# Patient Record
Sex: Male | Born: 1990 | Race: Black or African American | Hispanic: No | Marital: Single | Smoking: Current every day smoker
Health system: Southern US, Community
[De-identification: ages and names within clinical notes are randomized; demographics above are authoritative.]

## PROBLEM LIST (undated history)

## (undated) DIAGNOSIS — B20 Human immunodeficiency virus [HIV] disease: Secondary | ICD-10-CM

## (undated) HISTORY — DX: Human immunodeficiency virus (HIV) disease: B20

---

## 2011-08-11 ENCOUNTER — Emergency Department: Payer: Self-pay | Admitting: Internal Medicine

## 2012-02-15 ENCOUNTER — Emergency Department: Payer: Self-pay | Admitting: Emergency Medicine
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
## 2016-01-14 DIAGNOSIS — B20 Human immunodeficiency virus [HIV] disease: Secondary | ICD-10-CM

## 2016-01-14 DIAGNOSIS — Z21 Asymptomatic human immunodeficiency virus [HIV] infection status: Secondary | ICD-10-CM

## 2016-01-14 HISTORY — DX: Asymptomatic human immunodeficiency virus (hiv) infection status: Z21

## 2016-01-14 HISTORY — DX: Human immunodeficiency virus (HIV) disease: B20

## 2016-09-19 ENCOUNTER — Other Ambulatory Visit: Payer: Self-pay

## 2016-09-19 NOTE — Telephone Encounter (Signed)
Nurse calling with [facility]. Expected discharge Oct 28, 2016.  Recently diagnosed.  She will fax medical records and patient will have a 30 day supply of medication.  Laurell Josephsammy K Kodi Steil, RN

## 2016-11-12 ENCOUNTER — Ambulatory Visit: Payer: Self-pay

## 2016-11-12 ENCOUNTER — Ambulatory Visit: Payer: Self-pay | Admitting: Infectious Diseases

## 2017-11-18 ENCOUNTER — Ambulatory Visit (INDEPENDENT_AMBULATORY_CARE_PROVIDER_SITE_OTHER): Payer: Self-pay | Admitting: Infectious Diseases

## 2017-11-18 ENCOUNTER — Encounter: Payer: Self-pay | Admitting: Infectious Diseases

## 2017-11-18 VITALS — BP 145/78 | HR 64 | Temp 97.8°F | Ht 68.0 in | Wt 149.0 lb

## 2017-11-18 DIAGNOSIS — B20 Human immunodeficiency virus [HIV] disease: Secondary | ICD-10-CM

## 2017-11-18 DIAGNOSIS — Z113 Encounter for screening for infections with a predominantly sexual mode of transmission: Secondary | ICD-10-CM

## 2017-11-18 DIAGNOSIS — B009 Herpesviral infection, unspecified: Secondary | ICD-10-CM

## 2017-11-18 DIAGNOSIS — Z79899 Other long term (current) drug therapy: Secondary | ICD-10-CM

## 2017-11-18 NOTE — Assessment & Plan Note (Signed)
He appears to be doing well.  Will check his labs today Will get him in with ADAP and pharm Given condoms.  Will see him back in 3 months Explained functions of clinic and available resources.

## 2017-11-18 NOTE — Assessment & Plan Note (Signed)
Will defer rx at this point. Scabbed.

## 2017-11-18 NOTE — Progress Notes (Signed)
[facility] Visit  HPI: Robert Sharp is a 27 y.o. male who presents to the [facility] clinic today to initiate care with Dr. Ninetta LightsHatcher for his HIV infection.  Patient Active Problem List   Diagnosis Date Noted  . HIV disease (HCC) 11/18/2017  . HSV (herpes simplex virus) infection 11/18/2017    Patient's Medications  New Prescriptions   No medications on file  Previous Medications   ABACAVIR-DOLUTEGRAVIR-LAMIVUDINE (TRIUMEQ) 600-50-300 MG TABLET    Take 1 tablet by mouth daily.  Modified Medications   No medications on file  Discontinued Medications   No medications on file    Allergies: Not on File  Past Medical History: Past Medical History:  Diagnosis Date  . HIV (human immunodeficiency virus infection) (HCC) 01/2016    Social History: Social History   Socioeconomic History  . Marital status: Single    Spouse name: None  . Number of children: None  . Years of education: None  . Highest education level: None  Social Needs  . Financial resource strain: None  . Food insecurity - worry: None  . Food insecurity - inability: None  . Transportation needs - medical: None  . Transportation needs - non-medical: None  Occupational History  . None  Tobacco Use  . Smoking status: Former Games developermoker  . Smokeless tobacco: Never Used  Substance and Sexual Activity  . Alcohol use: No    Frequency: Never    Comment: occ  . Drug use: No  . Sexual activity: Not Currently  Other Topics Concern  . None  Social History Narrative  . None    Labs: No results found for: HIV1RNAQUANT, HIV1RNAVL, CD4TABS, HEPBSAB, HEPBSAG, HCVAB  Lipids: No results found for: CHOL, TRIG, HDL, CHOLHDL, VLDL, LDLCALC  Current HIV Regimen: Triumeq  Assessment: Robert Sharp is here today to establish care with Dr. Ninetta LightsHatcher for hi HIV infection.  He is currently on Triumeq and has been for over a year.  No issues or side effects with Triumeq. No problems today.  He  needs to get HMAP but states he already signed up and signed papers with someone.  Jonetta SpeakLuis will check to see if he has applied. He states he has 1 month of Triumeq. I gave him my card and told him to call me when he had 7 days left of his Triumeq to make sure he does not run out medication before he is approved.   Plan: - Continue Triumeq PO once daily - Call me with 7 pills left to make sure you don't run out  [name] L. Kuppelweiser, PharmD, AAHIVP, CPP Infectious Diseases Clinical Pharmacist [facility] 11/18/2017, 11:11 AM

## 2017-11-18 NOTE — Progress Notes (Signed)
   Subjective:    Patient ID: Robert Sharp, male    DOB: 12/02/1990, 27 y.o.   MRN: [id number]  HPI 27 yo M with HIV+, recently released from incarceration 11-16-17. Dx 01-2016 when incarcerated.  Has been on triumeq. Has been feeling well.   The past medical history, family history and social history were reviewed/updated in EPIC  Currently single.  Lives with Aunt. Feels like he has good support.   Review of Systems  Constitutional: Negative for appetite change and unexpected weight change.  HENT: Positive for mouth sores. Negative for trouble swallowing.   Respiratory: Negative for cough and shortness of breath.   Cardiovascular: Negative for chest pain.  Gastrointestinal: Negative for constipation and diarrhea.  Genitourinary: Negative for difficulty urinating and genital sores.  Neurological: Negative for headaches.  Psychiatric/Behavioral: Negative for dysphoric mood and sleep disturbance.  Please see HPI. All other systems reviewed and negative.      Objective:   Physical Exam  Constitutional: He is oriented to person, place, and time. He appears well-developed and well-nourished.  HENT:  Mouth/Throat: No oropharyngeal exudate.  Eyes: EOM are normal. Pupils are equal, round, and reactive to light.  Neck: Neck supple.  Cardiovascular: Normal rate, regular rhythm and normal heart sounds.  Pulmonary/Chest: Effort normal and breath sounds normal.  Abdominal: Soft. Bowel sounds are normal. There is no tenderness. There is no rebound.  Musculoskeletal: He exhibits no edema.  Lymphadenopathy:    He has no cervical adenopathy.  Neurological: He is alert and oriented to person, place, and time. No cranial nerve deficit.  Skin:     Psychiatric: He has a normal mood and affect.          Assessment & Plan:

## 2017-11-19 LAB — URINE CYTOLOGY ANCILLARY ONLY
CHLAMYDIA, DNA PROBE: NEGATIVE
Neisseria Gonorrhea: NEGATIVE

## 2017-11-19 LAB — T-HELPER CELL (CD4) - (RCID CLINIC ONLY)
CD4 T CELL HELPER: 37 % (ref 33–55)
CD4 T Cell Abs: 850 /uL (ref 400–2700)

## 2017-11-20 ENCOUNTER — Encounter: Payer: Self-pay | Admitting: Infectious Diseases

## 2017-11-20 DIAGNOSIS — Z789 Other specified health status: Secondary | ICD-10-CM | POA: Insufficient documentation

## 2017-11-20 LAB — HIV-1 RNA QUANT-NO REFLEX-BLD
HIV 1 RNA QUANT: NOT DETECTED {copies}/mL
HIV-1 RNA QUANT, LOG: NOT DETECTED {Log_copies}/mL

## 2017-11-21 LAB — QUANTIFERON-TB GOLD PLUS
MITOGEN-NIL: 4.37 [IU]/mL
NIL: 0.05 [IU]/mL
QuantiFERON-TB Gold Plus: NEGATIVE
TB1-NIL: <0 IU/mL
TB2-NIL: <0 IU/mL

## 2017-11-23 ENCOUNTER — Encounter: Payer: Self-pay | Admitting: Infectious Diseases

## 2017-11-25 ENCOUNTER — Telehealth: Payer: Self-pay | Admitting: Infectious Diseases

## 2017-11-25 NOTE — Telephone Encounter (Signed)
Called pt and explained his labs to him.

## 2017-11-26 LAB — HLA B*5701: HLA-B*5701 w/rflx HLA-B High: NEGATIVE

## 2017-11-26 LAB — RPR: RPR Ser Ql: NONREACTIVE

## 2017-11-26 LAB — LIPID PANEL
CHOL/HDL RATIO: 3.2 (calc) (ref <5.0)
Cholesterol: 218 mg/dL — ABNORMAL HIGH (ref <200)
HDL: 69 mg/dL (ref >40)
LDL Cholesterol (Calc): 134 mg/dL (calc) — ABNORMAL HIGH
NON-HDL CHOLESTEROL (CALC): 149 mg/dL — AB (ref <130)
TRIGLYCERIDES: 56 mg/dL (ref <150)

## 2017-11-26 LAB — COMPREHENSIVE METABOLIC PANEL
AG Ratio: 1.3 (calc) (ref 1.0–2.5)
ALT: 7 U/L — AB (ref 9–46)
AST: 18 U/L (ref 10–40)
Albumin: 4.7 g/dL (ref 3.6–5.1)
Alkaline phosphatase (APISO): 89 U/L (ref 40–115)
BUN: 16 mg/dL (ref 7–25)
CHLORIDE: 97 mmol/L — AB (ref 98–110)
CO2: 25 mmol/L (ref 20–32)
CREATININE: 1.16 mg/dL (ref 0.60–1.35)
Calcium: 10.1 mg/dL (ref 8.6–10.3)
GLUCOSE: 75 mg/dL (ref 65–99)
Globulin: 3.6 g/dL (calc) (ref 1.9–3.7)
Potassium: 4.7 mmol/L (ref 3.5–5.3)
SODIUM: 136 mmol/L (ref 135–146)
TOTAL PROTEIN: 8.3 g/dL — AB (ref 6.1–8.1)
Total Bilirubin: 0.4 mg/dL (ref 0.2–1.2)

## 2017-11-26 LAB — CBC
HCT: 42.2 % (ref 38.5–50.0)
Hemoglobin: 14.8 g/dL (ref 13.2–17.1)
MCH: 32.8 pg (ref 27.0–33.0)
MCHC: 35.1 g/dL (ref 32.0–36.0)
MCV: 93.6 fL (ref 80.0–100.0)
MPV: 9.5 fL (ref 7.5–12.5)
PLATELETS: 437 10*3/uL — AB (ref 140–400)
RBC: 4.51 10*6/uL (ref 4.20–5.80)
RDW: 13.6 % (ref 11.0–15.0)
WBC: 4.8 10*3/uL (ref 3.8–10.8)

## 2017-11-26 LAB — HEPATITIS A ANTIBODY, TOTAL: Hepatitis A AB,Total: NONREACTIVE

## 2017-11-26 LAB — HEPATITIS C ANTIBODY
Hepatitis C Ab: NONREACTIVE
SIGNAL TO CUT-OFF: 0.04 (ref <1.00)

## 2017-11-26 LAB — HEPATITIS B SURFACE ANTIBODY,QUALITATIVE: Hep B S Ab: REACTIVE — AB

## 2017-12-06 ENCOUNTER — Encounter: Payer: Self-pay | Admitting: Infectious Diseases

## 2017-12-18 ENCOUNTER — Other Ambulatory Visit: Payer: Self-pay | Admitting: *Deleted

## 2017-12-18 DIAGNOSIS — B20 Human immunodeficiency virus [HIV] disease: Secondary | ICD-10-CM

## 2017-12-18 MED ORDER — ABACAVIR-DOLUTEGRAVIR-LAMIVUD 600-50-300 MG PO TABS: 1.0000 | ORAL_TABLET | Freq: Every day | ORAL | 2 refills | Status: DC

## 2017-12-18 NOTE — Progress Notes (Signed)
For patient assistance

## 2017-12-20 ENCOUNTER — Other Ambulatory Visit: Payer: Self-pay | Admitting: Pharmacist Clinician (PhC)/ Clinical Pharmacy Specialist

## 2017-12-20 DIAGNOSIS — B20 Human immunodeficiency virus [HIV] disease: Secondary | ICD-10-CM

## 2017-12-20 MED ORDER — ABACAVIR-DOLUTEGRAVIR-LAMIVUD 600-50-300 MG PO TABS: 1.0000 | ORAL_TABLET | Freq: Every day | ORAL | 2 refills | Status: DC

## 2017-12-20 MED FILL — TRIUMEQ 600-50-300 MG TABS: 600-50-300 | 30 days supply | Qty: 30 | Fill #0

## 2017-12-20 NOTE — Progress Notes (Addendum)
Approved through [employer] right now for 3 months while waiting on ADAP.   ID [id number] BIN [id number] PCN [id number] GRP [id number]

## 2017-12-30 ENCOUNTER — Encounter: Payer: Self-pay | Admitting: Infectious Diseases

## 2018-01-07 ENCOUNTER — Other Ambulatory Visit: Payer: Self-pay | Admitting: Pharmacist Clinician (PhC)/ Clinical Pharmacy Specialist

## 2018-01-13 MED FILL — TRIUMEQ 600-50-300 MG TABS: 600-50-300 | 30 days supply | Qty: 30 | Fill #1

## 2018-02-12 ENCOUNTER — Other Ambulatory Visit: Payer: Self-pay

## 2018-02-12 ENCOUNTER — Encounter: Payer: Self-pay | Admitting: Infectious Diseases

## 2018-02-12 DIAGNOSIS — B20 Human immunodeficiency virus [HIV] disease: Secondary | ICD-10-CM

## 2018-02-13 LAB — T-HELPER CELL (CD4) - (RCID CLINIC ONLY)
CD4 T CELL ABS: 910 /uL (ref 400–2700)
CD4 T CELL HELPER: 28 % — AB (ref 33–55)

## 2018-02-14 LAB — HIV-1 RNA QUANT-NO REFLEX-BLD
HIV 1 RNA Quant: <20 copies/mL
HIV-1 RNA Quant, Log: <1.3 Log copies/mL

## 2018-02-17 ENCOUNTER — Other Ambulatory Visit: Payer: Self-pay

## 2018-03-03 ENCOUNTER — Encounter: Payer: Self-pay | Admitting: Infectious Diseases

## 2018-03-03 ENCOUNTER — Ambulatory Visit (INDEPENDENT_AMBULATORY_CARE_PROVIDER_SITE_OTHER): Payer: Self-pay | Admitting: Infectious Diseases

## 2018-03-03 VITALS — BP 121/76 | HR 71 | Temp 98.0°F | Ht 68.0 in | Wt 139.0 lb

## 2018-03-03 DIAGNOSIS — Z23 Encounter for immunization: Secondary | ICD-10-CM

## 2018-03-03 DIAGNOSIS — B20 Human immunodeficiency virus [HIV] disease: Secondary | ICD-10-CM

## 2018-03-03 NOTE — Addendum Note (Signed)
Addended by: Towanda Octave on: 03/03/2018 11:21 AM   Modules accepted: Orders

## 2018-03-03 NOTE — Assessment & Plan Note (Signed)
He is doing well Will continue triumeq He has condoms He will get prevnar today Offer Hep A as well Will see him back in 6 months

## 2018-03-03 NOTE — Progress Notes (Signed)
   Subjective:    Patient ID: Robert Sharp, male    DOB: May 23, 1991, 27 y.o.   MRN: [id number]  HPI 27 yo M with HIV+, recently released from incarceration 11-16-17. Dx 01-2016 when incarcerated.  Has been on triumeq. He had first visit in [facility] on 11-2017 and was continued on triumeq.  He has also been seen by pharm.  Has been taking triumeq without problems.  Worried has multiple bug bites while sleeping.  Does not feel like his usual eczema.   HIV 1 RNA Quant (copies/mL)  Date Value  02/12/2018 <20 NOT DETECTED  11/18/2017 <20 NOT DETECTED   CD4 T Cell Abs (/uL)  Date Value  02/12/2018 910  11/18/2017 850    Review of Systems  Constitutional: Negative for appetite change and unexpected weight change.  Respiratory: Negative for cough and shortness of breath.   Gastrointestinal: Negative for abdominal pain, constipation and diarrhea.  Genitourinary: Negative for difficulty urinating.  Psychiatric/Behavioral: Negative for sleep disturbance.  Please see HPI. All other systems reviewed and negative.     Objective:   Physical Exam  Constitutional: He appears well-developed and well-nourished.  HENT:  Mouth/Throat: No oropharyngeal exudate.  Eyes: Pupils are equal, round, and reactive to light. Conjunctivae and EOM are normal.  Neck: Normal range of motion. Neck supple.  Cardiovascular: Normal rate, regular rhythm and normal heart sounds.  Pulmonary/Chest: Effort normal and breath sounds normal.  Abdominal: Soft. Bowel sounds are normal. He exhibits no distension.  Musculoskeletal: Normal range of motion. He exhibits no edema.  Lymphadenopathy:    He has no cervical adenopathy.  Skin:     Psychiatric: He has a normal mood and affect.      Assessment & Plan:

## 2018-04-12 ENCOUNTER — Other Ambulatory Visit: Payer: Self-pay

## 2018-04-12 ENCOUNTER — Encounter (HOSPITAL_COMMUNITY): Payer: Self-pay | Admitting: Emergency Medicine

## 2018-04-12 ENCOUNTER — Emergency Department (HOSPITAL_COMMUNITY)
Admission: EM | Admit: 2018-04-12 | Discharge: 2018-04-12 | Disposition: A | Discharge status: Home / Self Care | Payer: Self-pay | Attending: Emergency Medicine | Admitting: Emergency Medicine

## 2018-04-12 DIAGNOSIS — R5383 Other fatigue: Secondary | ICD-10-CM | POA: Insufficient documentation

## 2018-04-12 DIAGNOSIS — Z87891 Personal history of nicotine dependence: Secondary | ICD-10-CM | POA: Insufficient documentation

## 2018-04-12 DIAGNOSIS — Z21 Asymptomatic human immunodeficiency virus [HIV] infection status: Secondary | ICD-10-CM | POA: Insufficient documentation

## 2018-04-12 DIAGNOSIS — Z79899 Other long term (current) drug therapy: Secondary | ICD-10-CM | POA: Insufficient documentation

## 2018-04-12 LAB — BASIC METABOLIC PANEL
Anion gap: 11 (ref 5–15)
BUN: 9 mg/dL (ref 6–20)
CALCIUM: 9.4 mg/dL (ref 8.9–10.3)
CO2: 27 mmol/L (ref 22–32)
Chloride: 97 mmol/L — ABNORMAL LOW (ref 98–111)
Creatinine, Ser: 1.02 mg/dL (ref 0.61–1.24)
GFR calc Af Amer: >60 mL/min (ref >60)
GFR calc non Af Amer: >60 mL/min (ref >60)
GLUCOSE: 99 mg/dL (ref 70–99)
POTASSIUM: 4.3 mmol/L (ref 3.5–5.1)
Sodium: 135 mmol/L (ref 135–145)

## 2018-04-12 LAB — CBC
HEMATOCRIT: 44.5 % (ref 39.0–52.0)
HEMOGLOBIN: 14.9 g/dL (ref 13.0–17.0)
MCH: 33.5 pg (ref 26.0–34.0)
MCHC: 33.5 g/dL (ref 30.0–36.0)
MCV: 100 fL (ref 78.0–100.0)
Platelets: 383 10*3/uL (ref 150–400)
RBC: 4.45 MIL/uL (ref 4.22–5.81)
RDW: 15.1 % (ref 11.5–15.5)
WBC: 5.9 10*3/uL (ref 4.0–10.5)

## 2018-04-12 NOTE — Discharge Instructions (Addendum)
Continue your current medications, follow up with your primary care doctor next week if your symptoms persist

## 2018-04-12 NOTE — ED Provider Notes (Signed)
[facility] EMERGENCY DEPARTMENT Provider Note   CSN: [id number] Arrival date & time: 04/12/18  1206     History   Chief Complaint Chief Complaint  Patient presents with  . Fatigue    HPI Robert Sharp is a 27 y.o. male.  HPI Presented to the emergency room for evaluation of generalized fatigue.  Patient states the symptoms started about a week ago.  He was not able to go to work for a couple of days.  He was scheduled to go back to work today but want to make sure everything was okay.  He denies any trouble with headache.  No chest pain.  No fevers or chills.  No vomiting or diarrhea.  Patient has been able to eat and drink although he does not think his appetite is great Past Medical History:  Diagnosis Date  . HIV (human immunodeficiency virus infection) (HCC) 01/2016    Patient Active Problem List   Diagnosis Date Noted  . Hepatitis B immune 11/20/2017  . HIV disease (HCC) 11/18/2017  . HSV (herpes simplex virus) infection 11/18/2017    History reviewed. No pertinent surgical history.      Home Medications    Prior to Admission medications   Medication Sig Start Date End Date Taking? Authorizing Provider  abacavir-dolutegravir-lamiVUDine (TRIUMEQ) 600-50-300 MG tablet Take 1 tablet by mouth daily. 12/20/17   Ginnie Smart, MD    Family History History reviewed. No pertinent family history.  Social History Social History   Tobacco Use  . Smoking status: Former Games developer  . Smokeless tobacco: Never Used  Substance Use Topics  . Alcohol use: No    Frequency: Never    Comment: occ  . Drug use: No     Allergies   Patient has no known allergies.   Review of Systems Review of Systems  All other systems reviewed and are negative.    Physical Exam Updated Vital Signs BP (!) 145/104 (BP Location: Right Arm)   Pulse 73   Temp 99 F (37.2 C) (Oral)   Resp 16   SpO2 100%   Physical Exam  Constitutional: He appears  well-developed and well-nourished. No distress.  HENT:  Head: Normocephalic and atraumatic.  Right Ear: External ear normal.  Left Ear: External ear normal.  Eyes: Conjunctivae are normal. Right eye exhibits no discharge. Left eye exhibits no discharge. No scleral icterus.  Neck: Neck supple. No tracheal deviation present.  Cardiovascular: Normal rate, regular rhythm and intact distal pulses.  Pulmonary/Chest: Effort normal and breath sounds normal. No stridor. No respiratory distress. He has no wheezes. He has no rales.  Abdominal: Soft. Bowel sounds are normal. He exhibits no distension. There is no tenderness. There is no rebound and no guarding.  Musculoskeletal: He exhibits no edema or tenderness.  Neurological: He is alert. He has normal strength. No cranial nerve deficit (no facial droop, extraocular movements intact, no slurred speech) or sensory deficit. He exhibits normal muscle tone. He displays no seizure activity. Coordination normal.  Skin: Skin is warm and dry. No rash noted.  Psychiatric: He has a normal mood and affect.  Nursing note and vitals reviewed.    ED Treatments / Results  Labs (all labs ordered are listed, but only abnormal results are displayed) Labs Reviewed  BASIC METABOLIC PANEL - Abnormal; Notable for the following components:      Result Value   Chloride 97 (*)    All other components within normal limits  CBC  Procedures Procedures (including critical care time)  Medications Ordered in ED Medications - No data to display   Initial Impression / Assessment and Plan / ED Course  I have reviewed the triage vital signs and the nursing notes.  Pertinent labs & imaging results that were available during my care of the patient were reviewed by me and considered in my medical decision making (see chart for details).  Clinical Course as of Apr 12 1405  Sat Apr 12, 2018  1405 Labs reviewed.  No clinically significant abnormalities   [JK]      Clinical Course User Index [JK] Linwood DibblesKnapp, Aloura Matsuoka, MD   Patient presented with complaints of fatigue.  He is not having any trouble with any symptoms to suggest infection.  He is not having any pain anywhere.  He has been able to eat and drink.  Patient is able to walk around without any difficulty.  He has a normal neuro exam.  ED work-up is reassuring.  No clear etiology but he appears stable for discharge.  Patient states he wants to go back to work today.  Work note provided.  Final Clinical Impressions(s) / ED Diagnoses   Final diagnoses:  Fatigue, unspecified type    ED Discharge Orders    None       Linwood DibblesKnapp, Lillieann Pavlich, MD 04/12/18 1406

## 2018-04-12 NOTE — ED Notes (Signed)
Patient stating that he needs to leave to go to work, Dr. Lynelle DoctorKnapp at bedside speaking with patient.

## 2018-04-12 NOTE — ED Triage Notes (Signed)
Pt presents to ED for assessment of generalized weakness and fatigue x 1 week.  Hx of HIV, states he has been taking all of his medications on schedule.  Denies fevers and chills.

## 2018-05-02 ENCOUNTER — Ambulatory Visit: Payer: Self-pay

## 2018-08-02 ENCOUNTER — Other Ambulatory Visit: Payer: Self-pay | Admitting: Infectious Diseases

## 2018-08-02 DIAGNOSIS — B20 Human immunodeficiency virus [HIV] disease: Secondary | ICD-10-CM

## 2018-08-20 ENCOUNTER — Other Ambulatory Visit: Payer: Self-pay

## 2018-08-21 ENCOUNTER — Encounter: Payer: Self-pay | Admitting: Infectious Diseases

## 2018-09-02 ENCOUNTER — Ambulatory Visit: Payer: Self-pay | Admitting: Infectious Diseases

## 2018-09-03 ENCOUNTER — Ambulatory Visit: Payer: Self-pay | Admitting: Infectious Diseases

## 2018-09-15 ENCOUNTER — Other Ambulatory Visit: Payer: Self-pay | Admitting: Infectious Diseases

## 2018-09-15 ENCOUNTER — Telehealth: Payer: Self-pay

## 2018-09-15 DIAGNOSIS — B20 Human immunodeficiency virus [HIV] disease: Secondary | ICD-10-CM

## 2018-09-15 NOTE — Telephone Encounter (Signed)
Called patient to schedule an appointment with our office for lab and with Dr. Ninetta LightsHatcher. Patient missed appointments scheduled in November. Was able to reach patient and schedule office visit. Patient denies missing any days of medicine. Is aware of appointments. Robert CourierJose Sharp Robert Sharp, [place]

## 2018-09-19 ENCOUNTER — Other Ambulatory Visit: Payer: Self-pay

## 2018-10-01 ENCOUNTER — Ambulatory Visit: Payer: Self-pay | Admitting: Infectious Diseases

## 2018-10-20 ENCOUNTER — Other Ambulatory Visit: Payer: Self-pay | Admitting: Infectious Diseases

## 2018-10-20 ENCOUNTER — Telehealth: Payer: Self-pay

## 2018-10-20 DIAGNOSIS — B20 Human immunodeficiency virus [HIV] disease: Secondary | ICD-10-CM

## 2018-10-20 NOTE — Telephone Encounter (Signed)
Attempted to call patient to schedule follow-up appointment with our office. Patient last seen on 02/2018 and has had multiple no shows. Left message for patient to call office to schedule appointment. Robert Sharp, [place]

## 2018-10-24 ENCOUNTER — Other Ambulatory Visit: Payer: Self-pay

## 2018-11-07 ENCOUNTER — Encounter: Payer: Self-pay | Admitting: Family

## 2018-12-01 ENCOUNTER — Other Ambulatory Visit: Payer: Self-pay | Admitting: Infectious Diseases

## 2018-12-01 DIAGNOSIS — B20 Human immunodeficiency virus [HIV] disease: Secondary | ICD-10-CM

## 2018-12-18 ENCOUNTER — Other Ambulatory Visit: Payer: Self-pay

## 2018-12-18 DIAGNOSIS — B20 Human immunodeficiency virus [HIV] disease: Secondary | ICD-10-CM

## 2018-12-18 MED ORDER — ABACAVIR-DOLUTEGRAVIR-LAMIVUD 600-50-300 MG PO TABS
1.0000 | ORAL_TABLET | Freq: Every day | ORAL | 0 refills | Status: DC
Start: 2018-12-18 — End: 2019-03-17

## 2018-12-18 NOTE — Progress Notes (Unsigned)
Patient called and schedule appointments with financial, lab and Marcos Eke, NP. Patient advised to keep scheduled appointment for continued refills.  Valarie Cones, LPN

## 2018-12-23 ENCOUNTER — Ambulatory Visit: Payer: Self-pay

## 2018-12-23 ENCOUNTER — Other Ambulatory Visit: Payer: Self-pay

## 2018-12-31 ENCOUNTER — Other Ambulatory Visit: Payer: Self-pay

## 2018-12-31 ENCOUNTER — Ambulatory Visit: Payer: Self-pay

## 2019-01-06 ENCOUNTER — Encounter: Payer: Self-pay | Admitting: Family

## 2019-01-17 ENCOUNTER — Other Ambulatory Visit: Payer: Self-pay | Admitting: Infectious Diseases

## 2019-01-17 DIAGNOSIS — B20 Human immunodeficiency virus [HIV] disease: Secondary | ICD-10-CM

## 2019-03-17 ENCOUNTER — Other Ambulatory Visit: Payer: Self-pay | Admitting: Infectious Diseases

## 2019-03-17 ENCOUNTER — Telehealth: Payer: Self-pay | Admitting: Pharmacy Technician

## 2019-03-17 ENCOUNTER — Ambulatory Visit: Payer: Self-pay

## 2019-03-17 ENCOUNTER — Other Ambulatory Visit: Payer: Self-pay

## 2019-03-17 ENCOUNTER — Encounter: Payer: Self-pay | Admitting: Infectious Diseases

## 2019-03-17 ENCOUNTER — Ambulatory Visit (INDEPENDENT_AMBULATORY_CARE_PROVIDER_SITE_OTHER): Payer: Self-pay | Admitting: Infectious Diseases

## 2019-03-17 VITALS — BP 132/60 | HR 80 | Temp 98.2°F | Ht 68.0 in | Wt 134.0 lb

## 2019-03-17 DIAGNOSIS — Z79899 Other long term (current) drug therapy: Secondary | ICD-10-CM

## 2019-03-17 DIAGNOSIS — B20 Human immunodeficiency virus [HIV] disease: Secondary | ICD-10-CM

## 2019-03-17 DIAGNOSIS — Z23 Encounter for immunization: Secondary | ICD-10-CM

## 2019-03-17 DIAGNOSIS — Z0000 Encounter for general adult medical examination without abnormal findings: Secondary | ICD-10-CM

## 2019-03-17 DIAGNOSIS — Z113 Encounter for screening for infections with a predominantly sexual mode of transmission: Secondary | ICD-10-CM

## 2019-03-17 MED ORDER — ABACAVIR-DOLUTEGRAVIR-LAMIVUD 600-50-300 MG PO TABS: 1.0000 | ORAL_TABLET | Freq: Every day | ORAL | 3 refills | Status: DC

## 2019-03-17 NOTE — Patient Instructions (Addendum)
Very nice to meet you today.   We will get you back on your Triumeq once a day for your HIV.   We need to see you back in July to re-apply for your HIV insurance (Ryan Garfield / ADAP). This will need to be done every January and July for as long as you need this assistance.   Please make an appointment to see Dr. Ninetta Lights or [name] again in 4 months to check in again.   Vaccines updated today: 1. Hepatitis A vaccine 2. Pneumonia vaccine

## 2019-03-17 NOTE — Progress Notes (Signed)
Subjective:    Patient ID: Robert Sharp, male    DOB: 1991-08-23, 28 y.o.   MRN: [id number]  HPI 28 yo M with HIV+, Dx 01-2016 when incarcerated (released Feb 2019) He has not been seen since May 2019 when he was put on Triumeq. He had continued to take this every day up until 2 weeks ago when he was asked to come in for office visit to refill his medications. He is nervous about being off his medications for this time frame and hopes he will be OK. No new concerns to note with his health. He has had no changes to his health history since our last meeting with him; no significant periods of illness, hospitalizations or ER visits.  He has not been sexually active for over a year as he is 'working on him'.    Review of Systems  Constitutional: Negative for appetite change and unexpected weight change.  Respiratory: Negative for cough and shortness of breath.   Gastrointestinal: Negative for abdominal pain, constipation and diarrhea.  Genitourinary: Negative for difficulty urinating.  Psychiatric/Behavioral: Negative for sleep disturbance.  Please see HPI. All other systems reviewed and negative.     Objective:   Physical Exam Constitutional:      Comments: Pleasant. Comfortably seated in chair.   HENT:     Mouth/Throat:     Mouth: No oral lesions.     Dentition: Normal dentition. No dental caries.  Eyes:     General: No scleral icterus. Cardiovascular:     Rate and Rhythm: Normal rate and regular rhythm.     Heart sounds: Normal heart sounds.  Pulmonary:     Effort: Pulmonary effort is normal.     Breath sounds: Normal breath sounds.  Abdominal:     General: There is no distension.     Palpations: Abdomen is soft.     Tenderness: There is no abdominal tenderness.  Lymphadenopathy:     Cervical: No cervical adenopathy.  Skin:    General: Skin is warm and dry.     Findings: No rash.  Neurological:     Mental Status: He is alert and oriented to person, place, and time.       Assessment & Plan:   Problem List Items Addressed This Visit      Unprioritized   Healthcare maintenance    Hepatitis A #2 today.       HIV disease (HCC) (Chronic)    Acknowledges that transportation is a problem for him. Relies on his grandmother who, although knows about his HIV, does not "always feel like driving him to appointments." I asked him to reach out to our clinic so we can get him back routinely to monitor him and ensure his success.   Will continue Triumeq for him - sounds like he stopped abruptly and did not space out his medicines so no significant concern with resistance mutations. Will draw VL, CD4, RPR and BMP/CBC today.   Safe sex discussed. Condoms declined. Reminded him about ADAP application coming up in July.   Return in about 4 months (around 07/17/2019). Can do labs at the visit in observance of his transportation difficulties.        Relevant Medications   abacavir-dolutegravir-lamiVUDine (TRIUMEQ) 600-50-300 MG tablet   Other Relevant Orders   HIV-1 RNA quant-no reflex-bld   T-helper cell (CD4)- ([facility] clinic only) (Completed)   Basic metabolic panel (Completed)   CBC (Completed)    Other Visit Diagnoses    Need for  hepatitis A vaccination    -  Primary   Routine screening for STI (sexually transmitted infection)       Relevant Orders   RPR (Completed)   High risk medication use       Relevant Orders   Lipid panel (Completed)      Rexene AlbertsStephanie Christophere Hillhouse, MSN, NP-C [facility]  [email or web address] Pager: [phone] Office: [phone] [facility] Main Line: [phone]

## 2019-03-17 NOTE — Telephone Encounter (Signed)
[facility] Patient Advocate Encounter   Was successful in obtaining Viiv 30 day fill for Triumeq.  This will make the patients copay 0 until he is approved for ADAP  I have spoken with the patient.    The billing information is as follows and has been shared with the patient.  RxBin: [id number] PCN: [id number] Member ID: [id number] Group ID: [id number]  Robert Sharp E. Dimas Aguas CPhT [facility] for Infectious Disease Phone: [phone] Fax:  [phone]

## 2019-03-18 LAB — T-HELPER CELL (CD4) - (RCID CLINIC ONLY)
CD4 % Helper T Cell: 39 % (ref 33–65)
CD4 T Cell Abs: 1001 /uL (ref 400–1790)

## 2019-03-19 DIAGNOSIS — Z0000 Encounter for general adult medical examination without abnormal findings: Secondary | ICD-10-CM | POA: Insufficient documentation

## 2019-03-19 NOTE — Assessment & Plan Note (Signed)
Hepatitis A #2 today

## 2019-03-19 NOTE — Assessment & Plan Note (Addendum)
Acknowledges that transportation is a problem for him. Relies on his grandmother who, although knows about his HIV, does not "always feel like driving him to appointments." I asked him to reach out to our clinic so we can get him back routinely to monitor him and ensure his success.   Will continue Triumeq for him - sounds like he stopped abruptly and did not space out his medicines so no significant concern with resistance mutations. Will draw VL, CD4, RPR and BMP/CBC today.   Safe sex discussed. Condoms declined. Reminded him about ADAP application coming up in July.   Return in about 4 months (around 07/17/2019). Can do labs at the visit in observance of his transportation difficulties.

## 2019-03-24 LAB — BASIC METABOLIC PANEL
BUN: 18 mg/dL (ref 7–25)
CO2: 22 mmol/L (ref 20–32)
Calcium: 10.1 mg/dL (ref 8.6–10.3)
Chloride: 104 mmol/L (ref 98–110)
Creat: 0.97 mg/dL (ref 0.60–1.35)
Glucose, Bld: 87 mg/dL (ref 65–99)
Potassium: 4.3 mmol/L (ref 3.5–5.3)
Sodium: 139 mmol/L (ref 135–146)

## 2019-03-24 LAB — CBC
HCT: 43.9 % (ref 38.5–50.0)
Hemoglobin: 15.2 g/dL (ref 13.2–17.1)
MCH: 33.6 pg — ABNORMAL HIGH (ref 27.0–33.0)
MCHC: 34.6 g/dL (ref 32.0–36.0)
MCV: 96.9 fL (ref 80.0–100.0)
MPV: 9.9 fL (ref 7.5–12.5)
Platelets: 397 10*3/uL (ref 140–400)
RBC: 4.53 10*6/uL (ref 4.20–5.80)
RDW: 13.3 % (ref 11.0–15.0)
WBC: 5.3 10*3/uL (ref 3.8–10.8)

## 2019-03-24 LAB — LIPID PANEL
Cholesterol: 223 mg/dL — ABNORMAL HIGH (ref <200)
HDL: 68 mg/dL (ref >=40)
LDL Cholesterol (Calc): 123 mg/dL (calc) — ABNORMAL HIGH
Non-HDL Cholesterol (Calc): 155 mg/dL (calc) — ABNORMAL HIGH (ref <130)
Total CHOL/HDL Ratio: 3.3 (calc) (ref <5.0)
Triglycerides: 206 mg/dL — ABNORMAL HIGH (ref <150)

## 2019-03-24 LAB — HIV-1 RNA QUANT-NO REFLEX-BLD
HIV 1 RNA Quant: 11900 copies/mL — ABNORMAL HIGH
HIV-1 RNA Quant, Log: 4.08 Log copies/mL — ABNORMAL HIGH

## 2019-03-24 LAB — RPR: RPR Ser Ql: NONREACTIVE

## 2019-04-14 ENCOUNTER — Other Ambulatory Visit: Payer: Self-pay | Admitting: Infectious Diseases

## 2019-04-14 DIAGNOSIS — B20 Human immunodeficiency virus [HIV] disease: Secondary | ICD-10-CM

## 2019-04-22 ENCOUNTER — Ambulatory Visit: Payer: Self-pay

## 2019-05-27 ENCOUNTER — Other Ambulatory Visit: Payer: Self-pay | Admitting: Pharmacist

## 2019-05-27 ENCOUNTER — Telehealth: Payer: Self-pay | Admitting: Pharmacy Technician

## 2019-05-27 DIAGNOSIS — B20 Human immunodeficiency virus [HIV] disease: Secondary | ICD-10-CM

## 2019-05-27 MED ORDER — TRIUMEQ 600-50-300 MG PO TABS: 1.0000 | ORAL_TABLET | Freq: Every day | ORAL | 0 refills | Status: DC

## 2019-05-27 NOTE — Progress Notes (Signed)
Printing off Triumeq Rx for [employer] connect patient assistance.

## 2019-05-27 NOTE — Telephone Encounter (Addendum)
Mr. Robert Sharp did not apply for HMAP as originally planned.  I have faxed an application to [employer] and he has been approved through 03/15/2020.  Their pharmacy will mail the medication directly to his home.  Venida Jarvis. Nadara Mustard [place] Patient [facility] for Infectious Disease Phone: [phone] Fax:  [phone]

## 2019-07-17 ENCOUNTER — Other Ambulatory Visit: Payer: Self-pay | Admitting: *Deleted

## 2019-07-17 DIAGNOSIS — B20 Human immunodeficiency virus [HIV] disease: Secondary | ICD-10-CM

## 2019-07-20 ENCOUNTER — Other Ambulatory Visit: Payer: Self-pay

## 2019-08-03 ENCOUNTER — Encounter: Payer: Self-pay | Admitting: Infectious Diseases

## 2019-10-20 ENCOUNTER — Encounter: Payer: Self-pay | Admitting: Infectious Diseases

## 2019-10-20 NOTE — Progress Notes (Signed)
Patient ID: Robert Sharp, male   DOB: 1991-05-11, 29 y.o.   MRN: [id number] Working Viral Load List called patient left message to call for appointment

## 2019-11-05 ENCOUNTER — Ambulatory Visit: Payer: Self-pay

## 2019-11-05 ENCOUNTER — Other Ambulatory Visit: Payer: Self-pay

## 2019-11-09 ENCOUNTER — Ambulatory Visit: Payer: Self-pay

## 2019-11-09 ENCOUNTER — Other Ambulatory Visit: Payer: Self-pay

## 2019-11-09 DIAGNOSIS — B20 Human immunodeficiency virus [HIV] disease: Secondary | ICD-10-CM

## 2019-11-10 LAB — T-HELPER CELL (CD4) - (RCID CLINIC ONLY)
CD4 % Helper T Cell: 44 % (ref 33–65)
CD4 T Cell Abs: 1266 /uL (ref 400–1790)

## 2019-11-12 LAB — HIV-1 RNA QUANT-NO REFLEX-BLD
HIV 1 RNA Quant: <20 copies/mL
HIV-1 RNA Quant, Log: <1.3 Log copies/mL

## 2019-11-26 ENCOUNTER — Other Ambulatory Visit: Payer: Self-pay

## 2019-11-26 ENCOUNTER — Encounter: Payer: Self-pay | Admitting: Infectious Diseases

## 2019-11-26 ENCOUNTER — Ambulatory Visit (INDEPENDENT_AMBULATORY_CARE_PROVIDER_SITE_OTHER): Payer: Self-pay | Admitting: Pharmacist

## 2019-11-26 DIAGNOSIS — B20 Human immunodeficiency virus [HIV] disease: Secondary | ICD-10-CM

## 2019-11-26 MED ORDER — TRIUMEQ 600-50-300 MG PO TABS: 1.0000 | ORAL_TABLET | Freq: Every day | ORAL | 5 refills | Status: DC

## 2019-11-26 NOTE — Progress Notes (Signed)
HPI: Robert Sharp is a 29 y.o. male who presents to the [facility] pharmacy clinic for HIV follow-up.  Patient Active Problem List   Diagnosis Date Noted  . Healthcare maintenance 03/19/2019  . Hepatitis B immune 11/20/2017  . HIV disease (HCC) 11/18/2017  . HSV (herpes simplex virus) infection 11/18/2017    Patient's Medications  New Prescriptions   No medications on file  Previous Medications   ABACAVIR-DOLUTEGRAVIR-LAMIVUDINE (TRIUMEQ) 600-50-300 MG TABLET    Take 1 tablet by mouth daily.  Modified Medications   No medications on file  Discontinued Medications   No medications on file    Allergies: No Known Allergies  Past Medical History: Past Medical History:  Diagnosis Date  . HIV (human immunodeficiency virus infection) (HCC) 01/2016    Social History: Social History   Socioeconomic History  . Marital status: Single    Spouse name: Not on file  . Number of children: Not on file  . Years of education: Not on file  . Highest education level: Not on file  Occupational History  . Not on file  Tobacco Use  . Smoking status: Former Games developer  . Smokeless tobacco: Never Used  Substance and Sexual Activity  . Alcohol use: Yes    Alcohol/week: 20.0 standard drinks    Types: 20 Cans of beer per week    Comment: 4-5 days / week  . Drug use: No  . Sexual activity: Yes    Partners: Male    Birth control/protection: Condom    Comment: declined condoms  Other Topics Concern  . Not on file  Social History Narrative  . Not on file   Social Determinants of Health   Financial Resource Strain:   . Difficulty of Paying Living Expenses: Not on file  Food Insecurity:   . Worried About Programme researcher, broadcasting/film/video in the Last Year: Not on file  . Ran Out of Food in the Last Year: Not on file  Transportation Needs:   . Lack of Transportation (Medical): Not on file  . Lack of Transportation (Non-Medical): Not on file  Physical Activity:   . Days of Exercise per Week: Not on  file  . Minutes of Exercise per Session: Not on file  Stress:   . Feeling of Stress : Not on file  Social Connections:   . Frequency of Communication with Friends and Family: Not on file  . Frequency of Social Gatherings with Friends and Family: Not on file  . Attends Religious Services: Not on file  . Active Member of Clubs or Organizations: Not on file  . Attends Banker Meetings: Not on file  . Marital Status: Not on file    Labs: Lab Results  Component Value Date   HIV1RNAQUANT <20 NOT DETECTED 11/09/2019   HIV1RNAQUANT 11,900 (H) 03/17/2019   HIV1RNAQUANT <20 NOT DETECTED 02/12/2018   CD4TABS 1,266 11/09/2019   CD4TABS 1,001 03/17/2019   CD4TABS 910 02/12/2018    RPR and STI Lab Results  Component Value Date   LABRPR NON-REACTIVE 03/17/2019   LABRPR NON-REACTIVE 11/18/2017    STI Results GC CT  11/18/2017 Negative Negative    Hepatitis B Lab Results  Component Value Date   HEPBSAB REACTIVE (A) 11/18/2017   Hepatitis C Lab Results  Component Value Date   HEPCAB NON-REACTIVE 11/18/2017   Hepatitis A Lab Results  Component Value Date   HAV NON-REACTIVE 11/18/2017   Lipids: Lab Results  Component Value Date   CHOL 223 (H) 03/17/2019  TRIG 206 (H) 03/17/2019   HDL 68 03/17/2019   CHOLHDL 3.3 03/17/2019   LDLCALC 123 (H) 03/17/2019    Current HIV Regimen: Triumeq  Assessment: Robert Sharp arrives to clinic intoxicated from alcohol and possibly high from marijuana. The patient refused to disclose how much he drank. He says he does not drive so he has issues with transportation to get to clinic. He is here for HIV follow-up. On 11/09/19, his HIV-1 RNA was undetectable and his CD4 count was 1,266. Robert Sharp reports being out of Trimeq for possibly up to 5 months. He was not sure how long it's been since he last took the medication. He says he does well on the Triumeq when he does take it.   Robert Sharp asked about his labs and we explained to him that  while he is still undetectable, he needs to start taking his medications again to remain undetectable. We told the patient that refills for Triumeq and he needs to go straight to [facility] on [address] to pick up his prescription after this appointment. He requested to have his medication mailed from now on. Sent rest of his prescriptions to [facility] in [place] so that he can have Triumeq delivered.  Robert Sharp was very drunk and hard to follow during conversation with him during the appointment.  Plan: - Labs: CBC, lipid panel, BMP, RPR - Vaccinations needed: influenza, meningitis, HPV - Continue Triumeq. Sent to [facility] on [address] - Follow-up with Stephanie 01/25/20 at 2:45 PM   Shahira Fiske, 4th Year PharmD Candidate 

## 2019-11-26 NOTE — Progress Notes (Unsigned)
HPI: Robert Sharp is a 29 y.o. male who presents to the [facility] pharmacy clinic for HIV follow-up.  Patient Active Problem List   Diagnosis Date Noted  . Healthcare maintenance 03/19/2019  . Hepatitis B immune 11/20/2017  . HIV disease (HCC) 11/18/2017  . HSV (herpes simplex virus) infection 11/18/2017    Patient's Medications  New Prescriptions   No medications on file  Previous Medications   ABACAVIR-DOLUTEGRAVIR-LAMIVUDINE (TRIUMEQ) 600-50-300 MG TABLET    Take 1 tablet by mouth daily.  Modified Medications   No medications on file  Discontinued Medications   No medications on file    Allergies: No Known Allergies  Past Medical History: Past Medical History:  Diagnosis Date  . HIV (human immunodeficiency virus infection) (HCC) 01/2016    Social History: Social History   Socioeconomic History  . Marital status: Single    Spouse name: Not on file  . Number of children: Not on file  . Years of education: Not on file  . Highest education level: Not on file  Occupational History  . Not on file  Tobacco Use  . Smoking status: Former Games developer  . Smokeless tobacco: Never Used  Substance and Sexual Activity  . Alcohol use: Yes    Alcohol/week: 20.0 standard drinks    Types: 20 Cans of beer per week    Comment: 4-5 days / week  . Drug use: No  . Sexual activity: Yes    Partners: Male    Birth control/protection: Condom    Comment: declined condoms  Other Topics Concern  . Not on file  Social History Narrative  . Not on file   Social Determinants of Health   Financial Resource Strain:   . Difficulty of Paying Living Expenses: Not on file  Food Insecurity:   . Worried About Programme researcher, broadcasting/film/video in the Last Year: Not on file  . Ran Out of Food in the Last Year: Not on file  Transportation Needs:   . Lack of Transportation (Medical): Not on file  . Lack of Transportation (Non-Medical): Not on file  Physical Activity:   . Days of Exercise per Week: Not on  file  . Minutes of Exercise per Session: Not on file  Stress:   . Feeling of Stress : Not on file  Social Connections:   . Frequency of Communication with Friends and Family: Not on file  . Frequency of Social Gatherings with Friends and Family: Not on file  . Attends Religious Services: Not on file  . Active Member of Clubs or Organizations: Not on file  . Attends Banker Meetings: Not on file  . Marital Status: Not on file    Labs: Lab Results  Component Value Date   HIV1RNAQUANT <20 NOT DETECTED 11/09/2019   HIV1RNAQUANT 11,900 (H) 03/17/2019   HIV1RNAQUANT <20 NOT DETECTED 02/12/2018   CD4TABS 1,266 11/09/2019   CD4TABS 1,001 03/17/2019   CD4TABS 910 02/12/2018    RPR and STI Lab Results  Component Value Date   LABRPR NON-REACTIVE 03/17/2019   LABRPR NON-REACTIVE 11/18/2017    STI Results GC CT  11/18/2017 Negative Negative    Hepatitis B Lab Results  Component Value Date   HEPBSAB REACTIVE (A) 11/18/2017   Hepatitis C Lab Results  Component Value Date   HEPCAB NON-REACTIVE 11/18/2017   Hepatitis A Lab Results  Component Value Date   HAV NON-REACTIVE 11/18/2017   Lipids: Lab Results  Component Value Date   CHOL 223 (H) 03/17/2019  TRIG 206 (H) 03/17/2019   HDL 68 03/17/2019   CHOLHDL 3.3 03/17/2019   LDLCALC 123 (H) 03/17/2019    Current HIV Regimen: Triumeq  Assessment: Robert Sharp arrives to clinic intoxicated from alcohol and possibly high from marijuana. The patient refused to disclose how much he drank. He says he does not drive so he has issues with transportation to get to clinic. He is here for HIV follow-up. On 11/09/19, his HIV-1 RNA was undetectable and his CD4 count was 1,266. Robert Sharp reports being out of Trimeq for possibly up to 5 months. He was not sure how long it's been since he last took the medication. He says he does well on the Triumeq when he does take it.   Robert Sharp asked about his labs and we explained to him that  while he is still undetectable, he needs to start taking his medications again to remain undetectable. We told the patient that refills for Triumeq and he needs to go straight to [facility] on [address] to pick up his prescription after this appointment. He requested to have his medication mailed from now on. Sent rest of his prescriptions to [facility] in [place] so that he can have Triumeq delivered.  Robert Sharp was very drunk and hard to follow during conversation with him during the appointment.  Plan: - Labs: CBC, lipid panel, BMP, RPR - Vaccinations needed: influenza, meningitis, HPV - Continue Triumeq. Sent to [facility] on [address] - Follow-up with Colletta Maryland 01/25/20 at 2:45 PM   Julieta Bellini, 4th Year PharmD Candidate

## 2019-11-26 NOTE — Progress Notes (Deleted)
   Subjective:    Patient ID: Robert Sharp, male    DOB: [birth year], 29 y.o.   MRN: [id number]   CC:  HIV follow up. Last office visit in June 2020.    HPI Dianne Bady is a 29 y.o. male with HIV, Dx 01-2016 during incarceration.   Previous ART:   Triumeq    Completed Hep A vaccines last visit and continued on Triumeq. Viral load 6200 copies last June at visit.  He has since been doing great about taking his ARVs everyday and recently VL < 20.    Review of Systems     Objective:   Physical Exam Constitutional:      Comments: Pleasant. Comfortably seated in chair.   HENT:     Mouth/Throat:     Mouth: No oral lesions.     Dentition: Normal dentition. No dental caries.  Eyes:     General: No scleral icterus. Cardiovascular:     Rate and Rhythm: Normal rate and regular rhythm.     Heart sounds: Normal heart sounds.  Pulmonary:     Effort: Pulmonary effort is normal.     Breath sounds: Normal breath sounds.  Abdominal:     General: There is no distension.     Palpations: Abdomen is soft.     Tenderness: There is no abdominal tenderness.  Lymphadenopathy:     Cervical: No cervical adenopathy.  Skin:    General: Skin is warm and dry.     Findings: No rash.  Neurological:     Mental Status: He is alert and oriented to person, place, and time.       Assessment & Plan:   Problem List Items Addressed This Visit    None      Rexene Alberts, MSN, NP-C [facility]  [email or web address] Pager: [phone] Office: [phone] [facility] Main Line: [phone]

## 2019-11-27 ENCOUNTER — Encounter: Payer: Self-pay | Admitting: Infectious Diseases

## 2019-12-14 ENCOUNTER — Encounter: Payer: Self-pay | Admitting: Infectious Diseases

## 2020-01-25 ENCOUNTER — Ambulatory Visit: Payer: Medicaid Other | Admitting: Infectious Diseases

## 2020-01-25 ENCOUNTER — Telehealth: Payer: Self-pay

## 2020-01-25 NOTE — Telephone Encounter (Signed)
Per NP called patient to reschedule missed appointment with office. Left message requesting he call back. Lorenso Courier, [place]

## 2020-04-11 ENCOUNTER — Other Ambulatory Visit: Payer: Self-pay | Admitting: Infectious Diseases

## 2020-04-11 DIAGNOSIS — B20 Human immunodeficiency virus [HIV] disease: Secondary | ICD-10-CM

## 2020-05-10 ENCOUNTER — Telehealth: Payer: Self-pay

## 2020-05-10 NOTE — Telephone Encounter (Signed)
Patient called office today requesting referral to get covid test. States that his mom tested positive. Advised patient to go to Urgent Care or [facility] for testing. If not able to go to either will look into [facility] testing.  Will forward call to front desk to schedule follow up appointment and ADAP appt.  Robert Sharp, [place]

## 2020-05-23 ENCOUNTER — Other Ambulatory Visit: Payer: Self-pay

## 2020-05-23 ENCOUNTER — Ambulatory Visit: Payer: Medicaid Other

## 2020-05-23 ENCOUNTER — Other Ambulatory Visit: Payer: Medicaid Other

## 2020-05-23 DIAGNOSIS — B20 Human immunodeficiency virus [HIV] disease: Secondary | ICD-10-CM

## 2020-05-23 DIAGNOSIS — Z79899 Other long term (current) drug therapy: Secondary | ICD-10-CM

## 2020-05-23 DIAGNOSIS — Z113 Encounter for screening for infections with a predominantly sexual mode of transmission: Secondary | ICD-10-CM

## 2020-06-07 ENCOUNTER — Encounter: Payer: Medicaid Other | Admitting: Infectious Diseases

## 2021-07-20 ENCOUNTER — Ambulatory Visit: Payer: Self-pay

## 2021-07-20 ENCOUNTER — Ambulatory Visit: Payer: Self-pay | Admitting: Pharmacist

## 2021-07-21 ENCOUNTER — Ambulatory Visit: Payer: Self-pay

## 2021-07-21 ENCOUNTER — Encounter: Payer: Self-pay | Admitting: Infectious Diseases

## 2021-07-21 ENCOUNTER — Other Ambulatory Visit (HOSPITAL_COMMUNITY): Payer: Self-pay

## 2021-07-21 ENCOUNTER — Other Ambulatory Visit: Payer: Self-pay

## 2021-07-21 ENCOUNTER — Ambulatory Visit (INDEPENDENT_AMBULATORY_CARE_PROVIDER_SITE_OTHER): Payer: Self-pay | Admitting: Pharmacist

## 2021-07-21 DIAGNOSIS — Z23 Encounter for immunization: Secondary | ICD-10-CM

## 2021-07-21 DIAGNOSIS — B20 Human immunodeficiency virus [HIV] disease: Secondary | ICD-10-CM

## 2021-07-21 MED ORDER — BICTEGRAVIR-EMTRICITAB-TENOFOV 50-200-25 MG PO TABS: 1.0000 | ORAL_TABLET | Freq: Every day | ORAL | 0 refills | Status: DC

## 2021-07-21 NOTE — Progress Notes (Signed)
HPI: Robert Sharp is a 30 y.o. male who presents to the [facility] pharmacy clinic for HIV follow-up.  Patient Active Problem List   Diagnosis Date Noted   Healthcare maintenance 03/19/2019   Hepatitis B immune 11/20/2017   HIV disease (HCC) 11/18/2017   HSV (herpes simplex virus) infection 11/18/2017    Patient's Medications  New Prescriptions   No medications on file  Previous Medications   ABACAVIR-DOLUTEGRAVIR-LAMIVUDINE (TRIUMEQ) 600-50-300 MG TABLET    Take 1 tablet by mouth daily.  Modified Medications   No medications on file  Discontinued Medications   No medications on file    Allergies: No Known Allergies  Past Medical History: Past Medical History:  Diagnosis Date   HIV (human immunodeficiency virus infection) (HCC) 01/2016    Social History: Social History   Socioeconomic History   Marital status: Single    Spouse name: Not on file   Number of children: Not on file   Years of education: Not on file   Highest education level: Not on file  Occupational History   Not on file  Tobacco Use   Smoking status: Former   Smokeless tobacco: Never  Substance and Sexual Activity   Alcohol use: Yes    Alcohol/week: 20.0 standard drinks    Types: 20 Cans of beer per week    Comment: 4-5 days / week   Drug use: No   Sexual activity: Yes    Partners: Male    Birth control/protection: Condom    Comment: declined condoms  Other Topics Concern   Not on file  Social History Narrative   Not on file   Social Determinants of Health   Financial Resource Strain: Not on file  Food Insecurity: Not on file  Transportation Needs: Not on file  Physical Activity: Not on file  Stress: Not on file  Social Connections: Not on file    Labs: Lab Results  Component Value Date   HIV1RNAQUANT <20 NOT DETECTED 11/09/2019   HIV1RNAQUANT 11,900 (H) 03/17/2019   HIV1RNAQUANT <20 NOT DETECTED 02/12/2018   CD4TABS 1,266 11/09/2019   CD4TABS 1,001 03/17/2019   CD4TABS  910 02/12/2018    RPR and STI Lab Results  Component Value Date   LABRPR NON-REACTIVE 03/17/2019   LABRPR NON-REACTIVE 11/18/2017    STI Results GC CT  11/18/2017 Negative Negative    Hepatitis B Lab Results  Component Value Date   HEPBSAB REACTIVE (A) 11/18/2017   Hepatitis C Lab Results  Component Value Date   HEPCAB NON-REACTIVE 11/18/2017   Hepatitis A Lab Results  Component Value Date   HAV NON-REACTIVE 11/18/2017   Lipids: Lab Results  Component Value Date   CHOL 223 (H) 03/17/2019   TRIG 206 (H) 03/17/2019   HDL 68 03/17/2019   CHOLHDL 3.3 03/17/2019   LDLCALC 123 (H) 03/17/2019    Current HIV Regimen: Triumeq  Assessment: Robert Sharp presents to clinic today for HIV follow-up. He was last seen in clinic in February 2021 with Robert Sharp and Robert Sharp 2020 with Robert Sharp. He states he has been unable to attend appointments due to lack of transportation, so we reviewed Robert Sharp's transportation services. He states he lives with his aunt now and that she can drive him when needed. Heavily emphasized importance of adherence and need to follow up with appointments. Discussed risk of resistance developing with these periods of being on then off medication. He states he always took Triumeq everyday when he had some available.   He is uninsured and  completed ADAP application with Robert Sharp today. Has previously been on Triumeq for years but states he has been out for months. Last time he remembers taking it was in December last year. Discussed that we cannot send prescription until ADAP is approved so will need to switch to something we have samples of. Agreed to trial Biktarvy. Will check HIV RNA with genotype, CD4, Bmet, CBC, lipid profile, and RPR. Patient denies any recent sexual activity and deferred cytologies.   Explained that Robert Sharp is a one pill once daily medication with or without food and the importance of not missing any doses. Explained resistance and how it develops  and why it is so important to take Biktarvy daily and not skip days or doses. Counseled patient to take it around the same time each day. Counseled on what to do if dose is missed, if closer to missed dose take immediately, if closer to next dose then skip and resume normal schedule. Cautioned on possible side effects the first week or so including nausea, diarrhea, dizziness, and headaches but that they should resolve after the first couple of weeks. I reviewed patient medications and found no drug interactions. Counseled patient to separate Biktarvy from divalent cations including multivitamins. Discussed with patient to call clinic if he starts a new medication or herbal supplement. I gave the patient my card and told him to call me with any issues/questions/concerns.  Patient is due for multiple vaccines due to being out of care. Administered annual flu shot, monkeypox shot, and first HPV shot today. He is also now interested in getting the COVID shot which we will separate out from his monkeypox vaccine later. He is due for a tetanus booster and eventually PCV20. Scheduled to see me again in 1 month for second monkeypox shot and Biktarvy follow-up. Will then schedule second and third HPV shot.   Plan: Stop Robert Sharp Check HIV RNA with genotype, CD4, CBC, BMP, lipid profile and RPR Administer flu shot Administer monkeypox shot Administer HPV shot Follow-up with me on 11/11 at 3:15 for B20 follow-up and second monkeypox shot   Robert Sharp, PharmD, CPP Clinical Pharmacist Practitioner Infectious Diseases Clinical Pharmacist [facility] 07/21/2021, 3:58 PM

## 2021-07-25 ENCOUNTER — Other Ambulatory Visit: Payer: Self-pay | Admitting: Pharmacist

## 2021-07-25 DIAGNOSIS — B20 Human immunodeficiency virus [HIV] disease: Secondary | ICD-10-CM

## 2021-07-25 MED ORDER — BICTEGRAVIR-EMTRICITAB-TENOFOV 50-200-25 MG PO TABS: 1.0000 | ORAL_TABLET | Freq: Every day | ORAL | 0 refills | Status: AC

## 2021-07-25 NOTE — Progress Notes (Signed)
Medication Samples have been provided to the patient.  Drug name: Biktarvy        Strength: 50/200/25 mg       Qty: 35 tablets (5 bottles) LOT: [id number]   Exp.Date: 6/24  Dosing instructions: Take one tablet by mouth once daily  The patient has been instructed regarding the correct time, dose, and frequency of taking this medication, including desired effects and most common side effects.   Margarite Gouge, PharmD, CPP Clinical Pharmacist Practitioner Infectious Diseases Clinical Pharmacist [facility]

## 2021-07-27 LAB — CBC WITH DIFFERENTIAL/PLATELET
Absolute Monocytes: 809 cells/uL (ref 200–950)
Basophils Absolute: 61 cells/uL (ref 0–200)
Basophils Relative: 0.9 %
Eosinophils Absolute: 299 cells/uL (ref 15–500)
Eosinophils Relative: 4.4 %
HCT: 42.4 % (ref 38.5–50.0)
Hemoglobin: 14.2 g/dL (ref 13.2–17.1)
Lymphs Abs: 3223 cells/uL (ref 850–3900)
MCH: 31.9 pg (ref 27.0–33.0)
MCHC: 33.5 g/dL (ref 32.0–36.0)
MCV: 95.3 fL (ref 80.0–100.0)
MPV: 9.9 fL (ref 7.5–12.5)
Monocytes Relative: 11.9 %
Neutro Abs: 2407 cells/uL (ref 1500–7800)
Neutrophils Relative %: 35.4 %
Platelets: 342 10*3/uL (ref 140–400)
RBC: 4.45 10*6/uL (ref 4.20–5.80)
RDW: 14.1 % (ref 11.0–15.0)
Total Lymphocyte: 47.4 %
WBC: 6.8 10*3/uL (ref 3.8–10.8)

## 2021-07-27 LAB — BASIC METABOLIC PANEL
BUN: 10 mg/dL (ref 7–25)
CO2: 28 mmol/L (ref 20–32)
Calcium: 9.4 mg/dL (ref 8.6–10.3)
Chloride: 105 mmol/L (ref 98–110)
Creat: 0.98 mg/dL (ref 0.60–1.26)
Glucose, Bld: 82 mg/dL (ref 65–99)
Potassium: 4.6 mmol/L (ref 3.5–5.3)
Sodium: 140 mmol/L (ref 135–146)

## 2021-07-27 LAB — LIPID PANEL
Cholesterol: 191 mg/dL (ref <200)
HDL: 76 mg/dL (ref >=40)
LDL Cholesterol (Calc): 90 mg/dL (calc)
Non-HDL Cholesterol (Calc): 115 mg/dL (calc) (ref <130)
Total CHOL/HDL Ratio: 2.5 (calc) (ref <5.0)
Triglycerides: 149 mg/dL (ref <150)

## 2021-07-27 LAB — T-HELPER CELLS (CD4) COUNT (NOT AT ARMC)
Absolute CD4: 1036 cells/uL (ref 490–1740)
CD4 T Helper %: 33 % (ref 30–61)
Total lymphocyte count: 3141 cells/uL (ref 850–3900)

## 2021-07-27 LAB — HIV RNA, RTPCR W/R GT (RTI, PI,INT)
HIV 1 RNA Quant: 247 copies/mL — ABNORMAL HIGH
HIV-1 RNA Quant, Log: 2.39 Log copies/mL — ABNORMAL HIGH

## 2021-07-27 LAB — RPR: RPR Ser Ql: NONREACTIVE

## 2021-08-25 ENCOUNTER — Other Ambulatory Visit: Payer: Self-pay

## 2021-08-25 ENCOUNTER — Ambulatory Visit (INDEPENDENT_AMBULATORY_CARE_PROVIDER_SITE_OTHER): Payer: Self-pay | Admitting: Pharmacist

## 2021-08-25 ENCOUNTER — Other Ambulatory Visit: Payer: Self-pay | Admitting: Pharmacist

## 2021-08-25 DIAGNOSIS — B20 Human immunodeficiency virus [HIV] disease: Secondary | ICD-10-CM

## 2021-08-25 DIAGNOSIS — Z23 Encounter for immunization: Secondary | ICD-10-CM

## 2021-08-25 MED ORDER — BICTEGRAVIR-EMTRICITAB-TENOFOV 50-200-25 MG PO TABS: 1.0000 | ORAL_TABLET | Freq: Every day | ORAL | 0 refills | Status: AC

## 2021-08-25 MED ORDER — BICTEGRAVIR-EMTRICITAB-TENOFOV 50-200-25 MG PO TABS: 1.0000 | ORAL_TABLET | Freq: Every day | ORAL | 3 refills | Status: DC

## 2021-08-25 NOTE — Progress Notes (Signed)
Medication Samples have been provided to the patient.  Drug name: Biktarvy        Strength: 50/200/25 mg       Qty: 7 tablets (1 bottles) LOT: [id number]   Exp.Date: 10/24  Dosing instructions: Take one tablet by mouth once daily  The patient has been instructed regarding the correct time, dose, and frequency of taking this medication, including desired effects and most common side effects.   Jessee Mezera, PharmD, CPP Clinical Pharmacist Practitioner Infectious Diseases Clinical Pharmacist [facility]  

## 2021-08-25 NOTE — Progress Notes (Signed)
HPI: Robert Sharp is a 30 y.o. male who presents to the [facility] pharmacy clinic for HIV follow-up.  Patient Active Problem List   Diagnosis Date Noted   Healthcare maintenance 03/19/2019   Hepatitis B immune 11/20/2017   HIV disease (HCC) 11/18/2017   HSV (herpes simplex virus) infection 11/18/2017    Patient's Medications  New Prescriptions   No medications on file  Previous Medications   BICTEGRAVIR-EMTRICITABINE-TENOFOVIR AF (BIKTARVY) 50-200-25 MG TABS TABLET    Take 1 tablet by mouth daily.   BICTEGRAVIR-EMTRICITABINE-TENOFOVIR AF (BIKTARVY) 50-200-25 MG TABS TABLET    Take 1 tablet by mouth daily.  Modified Medications   No medications on file  Discontinued Medications   No medications on file    Allergies: No Known Allergies  Past Medical History: Past Medical History:  Diagnosis Date   HIV (human immunodeficiency virus infection) (HCC) 01/2016    Social History: Social History   Socioeconomic History   Marital status: Single    Spouse name: Not on file   Number of children: Not on file   Years of education: Not on file   Highest education level: Not on file  Occupational History   Not on file  Tobacco Use   Smoking status: Former   Smokeless tobacco: Never  Substance and Sexual Activity   Alcohol use: Yes    Alcohol/week: 20.0 standard drinks    Types: 20 Cans of beer per week    Comment: 4-5 days / week   Drug use: No   Sexual activity: Yes    Partners: Male    Birth control/protection: Condom    Comment: declined condoms  Other Topics Concern   Not on file  Social History Narrative   Not on file   Social Determinants of Health   Financial Resource Strain: Not on file  Food Insecurity: Not on file  Transportation Needs: Not on file  Physical Activity: Not on file  Stress: Not on file  Social Connections: Not on file    Labs: Lab Results  Component Value Date   HIV1RNAQUANT 247 (H) 07/21/2021   HIV1RNAQUANT <20 NOT DETECTED  11/09/2019   HIV1RNAQUANT 11,900 (H) 03/17/2019   CD4TABS 1,266 11/09/2019   CD4TABS 1,001 03/17/2019   CD4TABS 910 02/12/2018    RPR and STI Lab Results  Component Value Date   LABRPR NON-REACTIVE 07/21/2021   LABRPR NON-REACTIVE 03/17/2019   LABRPR NON-REACTIVE 11/18/2017    STI Results GC CT  11/18/2017 Negative Negative    Hepatitis B Lab Results  Component Value Date   HEPBSAB REACTIVE (A) 11/18/2017   Hepatitis C Lab Results  Component Value Date   HEPCAB NON-REACTIVE 11/18/2017   Hepatitis A Lab Results  Component Value Date   HAV NON-REACTIVE 11/18/2017   Lipids: Lab Results  Component Value Date   CHOL 191 07/21/2021   TRIG 149 07/21/2021   HDL 76 07/21/2021   CHOLHDL 2.5 07/21/2021   LDLCALC 90 07/21/2021    Current HIV Regimen: Biktarvy  Assessment: Robert Sharp presents to clinic today for HIV follow-up. His most recent CD4 was excellent at 3141 and most recent HIV RNA slightly detectable at 247. Will check HIV RNA today given his one month taking Biktarvy. He denies any adherence issues or adverse events. Completed his bottle today, so providing 1 bottle of samples today until he receives his next [place] delivery from [facility] in [place]. No new partners, so no STI screening today. Will have him follow-up with [name] in January. Reminded him  that although his ADAP is approved now, it will need to be renewed in January.  Patient will receive his second HPV shot today as well as his tetanus booster. Scheduled to receive his last HPV shot in April. Unable to administer 2nd monkeypox vaccine today; added to callback list.  Plan: Refill Biktarvy Check HIV RNA Administer HPV #2 and tetatnus booster Follow-up with [name] on 1/12 HPV #4 on 4/14  Margarite Gouge, PharmD, CPP Clinical Pharmacist Practitioner Infectious Diseases Clinical Pharmacist [facility] 08/25/2021, 3:10 PM

## 2021-08-27 LAB — HIV-1 RNA QUANT-NO REFLEX-BLD
HIV 1 RNA Quant: NOT DETECTED Copies/mL
HIV-1 RNA Quant, Log: NOT DETECTED Log cps/mL

## 2021-10-26 ENCOUNTER — Ambulatory Visit: Payer: Medicaid Other | Admitting: Infectious Diseases

## 2021-11-02 ENCOUNTER — Encounter: Payer: Self-pay | Admitting: Infectious Diseases

## 2021-11-02 ENCOUNTER — Other Ambulatory Visit: Payer: Self-pay

## 2021-11-02 ENCOUNTER — Ambulatory Visit (INDEPENDENT_AMBULATORY_CARE_PROVIDER_SITE_OTHER): Payer: Self-pay | Admitting: Infectious Diseases

## 2021-11-02 DIAGNOSIS — F32A Depression, unspecified: Secondary | ICD-10-CM

## 2021-11-02 DIAGNOSIS — B20 Human immunodeficiency virus [HIV] disease: Secondary | ICD-10-CM

## 2021-11-02 DIAGNOSIS — F419 Anxiety disorder, unspecified: Secondary | ICD-10-CM

## 2021-11-02 MED ORDER — BICTEGRAVIR-EMTRICITAB-TENOFOV 50-200-25 MG PO TABS: 1.0000 | ORAL_TABLET | Freq: Every day | ORAL | 3 refills | Status: DC

## 2021-11-02 MED ORDER — MIRTAZAPINE 15 MG PO TABS: 15.0000 mg | ORAL_TABLET | Freq: Every day | ORAL | 1 refills | Status: DC

## 2021-11-02 NOTE — Progress Notes (Signed)
° °Name: Robert Sharp  °DOB: 03/16/1991 °MRN: [id number] °PCP: Patient, No Pcp Per (Inactive)  ° ° °VIRTUAL CARE ENCOUNTER ° °I connected with Garry Timko on 11/03/21 at  3:30 PM EST by VIDEO and verified that I am speaking with the correct person using two identifiers. °  °I discussed the limitations, risks, security and privacy concerns of performing an evaluation and management service by telephone and the availability of in person appointments. I also discussed with the patient that there may be a patient responsible charge related to this service. The patient expressed understanding and agreed to proceed. ° °Patient Location: [facility]  ° °Other Participants: Rebecca Sarine, MSN Student  ° °Provider Location: [facility] Office  ° ° °Previous Regimens: °Biktarvy  ° °Genotypes: °Wildtype ° °Subjective:  ° °Chief Complaint  °Patient presents with  ° Follow-up  °  ° ° °HPI: °Doing well aside from worsening depression. He has noticed increase irritability and intolerance for family/friends. Isolating himself at home most of the time, though walks to his moms (15 min walk) sometimes. Has a hard time getting to sleep. No changes in weight or appetite that is concerning. No SI/HI or thoughts of self harm. He has never been on medications in the past to treat this.  ° °Physical pain in the legs, knees specifically. Feels like its more bones and joints not so much muscles. Both knees. Has to get up very slowly to avoid pain. When he gets up he sometimes limps but then eventually can get going. Not able to work ever since 2020 September. Has been unable to work with current problem of needing to be  off his feet.  ° °Continues his biktarvy everyday without any missed doses. Met with pharmacy team not long ago. Has had trouble getting to visits d/t transportation.  ° ° °Depression screen PHQ 2/9 11/02/2021  °Decreased Interest 2  °Down, Depressed, Hopeless 2  °PHQ - 2 Score 4  °Altered sleeping 2  °Tired, decreased  energy 2  °Change in appetite 1  °Feeling bad or failure about yourself  1  °Trouble concentrating 1  °Moving slowly or fidgety/restless 2  °Suicidal thoughts 0  °PHQ-9 Score 13  °Difficult doing work/chores Very difficult  ° ° ° °Review of Systems  °Constitutional:  Negative for chills, fever, malaise/fatigue and weight loss.  °HENT:  Negative for sore throat.   °Respiratory:  Negative for cough, sputum production and shortness of breath.   °Cardiovascular: Negative.   °Gastrointestinal:  Negative for abdominal pain, diarrhea and vomiting.  °Musculoskeletal:  Negative for joint pain, myalgias and neck pain.  °Skin:  Negative for rash.  °Neurological:  Negative for headaches.  °Psychiatric/Behavioral:  Positive for depression. Negative for substance abuse. The patient has insomnia. The patient is not nervous/anxious.   ° ° °Past Medical History:  °Diagnosis Date  ° HIV (human immunodeficiency virus infection) (HCC) 01/2016  ° ° °Outpatient Medications Prior to Visit  °Medication Sig Dispense Refill  ° bictegravir-emtricitabine-tenofovir AF (BIKTARVY) 50-200-25 MG TABS tablet Take 1 tablet by mouth daily. 30 tablet 3  ° bictegravir-emtricitabine-tenofovir AF (BIKTARVY) 50-200-25 MG TABS tablet Take 1 tablet by mouth daily. 30 tablet 0  ° °No facility-administered medications prior to visit.  °  ° °No Known Allergies ° °Social History  ° °Tobacco Use  ° Smoking status: Every Day  °  Packs/day: 0.50  °  Types: Cigarettes  ° Smokeless tobacco: Never  °Substance Use Topics  ° Alcohol use: Yes  °  Alcohol/week:   20.0 standard drinks  °  Types: 20 Cans of beer per week  °  Comment: 4-5 days / week  ° Drug use: No  ° ° °No family history on file. ° °Social History  ° °Substance and Sexual Activity  °Sexual Activity Yes  ° Partners: Male  ° Birth control/protection: Condom  ° Comment: declined condoms  ° ° ° °Objective:  °There were no vitals filed for this visit. °There is no height or weight on file to calculate  BMI. ° °Physical Exam °Constitutional:   °   Appearance: Normal appearance. He is not ill-appearing.  °HENT:  °   Head: Normocephalic.  °   Mouth/Throat:  °   Mouth: Mucous membranes are moist.  °   Pharynx: Oropharynx is clear.  °Eyes:  °   General: No scleral icterus. °Pulmonary:  °   Effort: Pulmonary effort is normal.  °Musculoskeletal:     °   General: Normal range of motion.  °   Cervical back: Normal range of motion.  °Skin: °   Coloration: Skin is not jaundiced or pale.  °Neurological:  °   Mental Status: He is alert and oriented to person, place, and time.  °Psychiatric:     °   Mood and Affect: Mood normal.     °   Judgment: Judgment normal.  ° ° °Lab Results °Lab Results  °Component Value Date  ° WBC 6.8 07/21/2021  ° HGB 14.2 07/21/2021  ° HCT 42.4 07/21/2021  ° MCV 95.3 07/21/2021  ° PLT 342 07/21/2021  °  °Lab Results  °Component Value Date  ° CREATININE 0.98 07/21/2021  ° BUN 10 07/21/2021  ° NA 140 07/21/2021  ° K 4.6 07/21/2021  ° CL 105 07/21/2021  ° CO2 28 07/21/2021  °  °Lab Results  °Component Value Date  ° ALT 7 (L) 11/18/2017  ° AST 18 11/18/2017  ° BILITOT 0.4 11/18/2017  °  °Lab Results  °Component Value Date  ° CHOL 191 07/21/2021  ° HDL 76 07/21/2021  ° LDLCALC 90 07/21/2021  ° TRIG 149 07/21/2021  ° CHOLHDL 2.5 07/21/2021  ° °HIV 1 RNA Quant  °Date Value  °08/25/2021 Not Detected Copies/mL  °07/21/2021 247 copies/mL (H)  °11/09/2019 <20 NOT DETECTED copies/mL  ° °CD4 T Cell Abs (/uL)  °Date Value  °11/09/2019 1,266  °03/17/2019 1,001  °02/12/2018 910  ° ° ° °Assessment & Plan:  ° °Problem List Items Addressed This Visit   ° °  ° Unprioritized  ° HIV disease (HCC) (Chronic)  °  Last VL in November 2022 was not detected. He is doing well on Biktarvy without any side effects.  °Will continue this for him. We discussed being able to set him up with transportation assistance with 2-d notice for future appointments. We do need to update some vaccines for him (COVID bivalent, flu and  meningitis). Can get second hmpx vaccine also if we have supply of this.  °Next time he is here in the office we can do labs same day and update them (March 2023). We set an appt up for him at the time of our video visit.  °  °  ° Relevant Medications  ° bictegravir-emtricitabine-tenofovir AF (BIKTARVY) 50-200-25 MG TABS tablet  ° Anxiety and depression  °  PHQ9 screen 13 today - we talked about starting treatment with counseling and initiation of antidepressant. Given sleep trouble and occasional low appetite will try Remeron 15 mg QHS. Can add on buspar   or hydroxyzine for anxiety if needed. Will FU in early March with new medication and give him some time with our counseling team. Informed that if this makes his mood worse or he notices new suicidal thoughts to stop the medication and let me know.  °Leg pain may be a physical symptom of major depression also - but I would like him to look for PCP local to him and work on being evaluated for other considerations of his pain.  °  °  ° Relevant Medications  ° mirtazapine (REMERON) 15 MG tablet  ° °Other Visit Diagnoses   ° ° Depression, unspecified depression type    -  Primary  ° Relevant Medications  ° mirtazapine (REMERON) 15 MG tablet  ° °  ° ° °Stephanie Dixon, MSN, NP-C °[facility] °[facility] °Pager: [phone] °Office: [phone] ° °11/03/21  °12:58 PM ° ° °

## 2021-11-03 DIAGNOSIS — F32A Depression, unspecified: Secondary | ICD-10-CM | POA: Insufficient documentation

## 2021-11-03 DIAGNOSIS — F419 Anxiety disorder, unspecified: Secondary | ICD-10-CM | POA: Insufficient documentation

## 2021-11-03 NOTE — Assessment & Plan Note (Signed)
Last VL in November 2022 was not detected. He is doing well on Biktarvy without any side effects.  Will continue this for him. We discussed being able to set him up with transportation assistance with 2-d notice for future appointments. We do need to update some vaccines for him (COVID bivalent, flu and meningitis). Can get second hmpx vaccine also if we have supply of this.  Next time he is here in the office we can do labs same day and update them (March 2023). We set an appt up for him at the time of our video visit.

## 2021-11-03 NOTE — Assessment & Plan Note (Signed)
PHQ9 screen 13 today - we talked about starting treatment with counseling and initiation of antidepressant. Given sleep trouble and occasional low appetite will try Remeron 15 mg QHS. Can add on buspar or hydroxyzine for anxiety if needed. Will FU in early March with new medication and give him some time with our counseling team. Informed that if this makes his mood worse or he notices new suicidal thoughts to stop the medication and let me know.  Leg pain may be a physical symptom of major depression also - but I would like him to look for PCP local to him and work on being evaluated for other considerations of his pain.

## 2021-11-09 ENCOUNTER — Ambulatory Visit: Payer: Self-pay

## 2021-11-09 ENCOUNTER — Other Ambulatory Visit: Payer: Self-pay

## 2021-12-18 ENCOUNTER — Ambulatory Visit: Payer: Medicaid Other | Admitting: Infectious Diseases

## 2021-12-21 ENCOUNTER — Ambulatory Visit: Payer: Medicaid Other | Admitting: Infectious Diseases

## 2021-12-27 ENCOUNTER — Other Ambulatory Visit: Payer: Self-pay | Admitting: Infectious Diseases

## 2021-12-27 DIAGNOSIS — F32A Depression, unspecified: Secondary | ICD-10-CM

## 2022-01-26 ENCOUNTER — Ambulatory Visit: Payer: Self-pay | Admitting: Pharmacist

## 2022-03-07 ENCOUNTER — Encounter: Payer: Self-pay | Admitting: Infectious Diseases

## 2023-11-28 ENCOUNTER — Encounter: Payer: Self-pay | Admitting: Internal Medicine

## 2023-11-28 ENCOUNTER — Other Ambulatory Visit: Payer: Self-pay

## 2023-11-28 ENCOUNTER — Other Ambulatory Visit (HOSPITAL_COMMUNITY): Payer: Self-pay

## 2023-11-28 ENCOUNTER — Other Ambulatory Visit (HOSPITAL_COMMUNITY)
Admission: RE | Admit: 2023-11-28 | Discharge: 2023-11-28 | Disposition: A | Discharge status: Home / Self Care | Payer: Commercial Managed Care - HMO | Source: Ambulatory Visit | Attending: Internal Medicine | Admitting: Internal Medicine

## 2023-11-28 ENCOUNTER — Ambulatory Visit (INDEPENDENT_AMBULATORY_CARE_PROVIDER_SITE_OTHER): Payer: Commercial Managed Care - HMO | Admitting: Internal Medicine

## 2023-11-28 VITALS — BP 133/80 | HR 86 | Temp 98.2°F | Ht 68.0 in | Wt 138.0 lb

## 2023-11-28 DIAGNOSIS — B20 Human immunodeficiency virus [HIV] disease: Secondary | ICD-10-CM | POA: Diagnosis present

## 2023-11-28 DIAGNOSIS — Z7185 Encounter for immunization safety counseling: Secondary | ICD-10-CM

## 2023-11-28 DIAGNOSIS — Z21 Asymptomatic human immunodeficiency virus [HIV] infection status: Secondary | ICD-10-CM | POA: Diagnosis not present

## 2023-11-28 DIAGNOSIS — Z23 Encounter for immunization: Secondary | ICD-10-CM

## 2023-11-28 MED ORDER — BICTEGRAVIR-EMTRICITAB-TENOFOV 50-200-25 MG PO TABS: 1.0000 | ORAL_TABLET | Freq: Every day | ORAL | 3 refills | Status: DC

## 2023-11-28 NOTE — Progress Notes (Signed)
[facility]     HPI: Robert Sharp is a 33 y.o. male presents for HIV management on Biktarvy. Incarcerated for 9 months, and transportation issues before that.  PT thinks he has een of a biktarvy a little less than ayear.then he was on another ART while incarserated.  Last ART was 1/27 as he was inacrceratied, he states he was discharged without ART.     Date of diagnosis 01-2016 ART exposure Triumeq->biktarvy Past Ois none Risk factors: MSM Partners in last 2 months 1, in the last 12 months1.  Anal sex receptiveyes,Contraception nok   Social: Occupation: looking for job Housing: Lives with aunt Support: Mom and aunt Understanding of HIV: Etoh/drug/tobacco use: 2 40s a day/ no/yes  Past Medical History:  Diagnosis Date   HIV (human immunodeficiency virus infection) (HCC) 01/2016    No past surgical history on file.  No family history on file. Current Outpatient Medications on File Prior to Visit  Medication Sig Dispense Refill   prazosin (MINIPRESS) 1 MG capsule Take 1 mg by mouth at bedtime.     sertraline (ZOLOFT) 100 MG tablet Take 100 mg by mouth daily.     bictegravir-emtricitabine-tenofovir AF (BIKTARVY) 50-200-25 MG TABS tablet Take 1 tablet by mouth daily. (Patient not taking: Reported on 11/28/2023) 30 tablet 3   mirtazapine (REMERON) 15 MG tablet TAKE 1 TABLET(15 MG) BY MOUTH AT BEDTIME (Patient not taking: Reported on 11/28/2023) 30 tablet 1   No current facility-administered medications on file prior to visit.    No Known Allergies    Lab Results HIV 1 RNA Quant  Date Value  08/25/2021 Not Detected Copies/mL  07/21/2021 247 copies/mL (H)  11/09/2019 <20 NOT DETECTED copies/mL   CD4 T Cell Abs (/uL)  Date Value  11/09/2019 1,266  03/17/2019 1,001  02/12/2018 910   No results found for: "HIV1GENOSEQ" Lab Results  Component Value Date   WBC 6.8 07/21/2021   HGB 14.2 07/21/2021   HCT 42.4 07/21/2021   MCV  95.3 07/21/2021   PLT 342 07/21/2021    Lab Results  Component Value Date   CREATININE 0.98 07/21/2021   BUN 10 07/21/2021   NA 140 07/21/2021   K 4.6 07/21/2021   CL 105 07/21/2021   CO2 28 07/21/2021   Lab Results  Component Value Date   ALT 7 (L) 11/18/2017   AST 18 11/18/2017   BILITOT 0.4 11/18/2017    Lab Results  Component Value Date   CHOL 191 07/21/2021   TRIG 149 07/21/2021   HDL 76 07/21/2021   LDLCALC 90 07/21/2021   Lab Results  Component Value Date   HAV NON-REACTIVE 11/18/2017   Lab Results  Component Value Date   HEPBSAB REACTIVE (A) 11/18/2017   No results found for: "HCVAB" Lab Results  Component Value Date   CHLAMYDIAWP Negative 11/18/2017   N Negative 11/18/2017   No results found for: "GCPROBEAPT" No results found for: "QUANTGOLD"  Assessment/Plan #HIV Results            Assessment and Plan HIV/Asymptomatic -CD4 1222 in 2021 , VLND 2022, on Biktarvy -Labs today -Would like pills mailed, gave sample -F/U in one month   #Vaccination COVID-today Flu- today Monkeypox PCV-20due 03/16/2024 Meningitis HepA 2019, 2020 HEpB Tdap 2022 Shingles HPVx2  #Health maintenance-today -Quantiferon -RPR -HCV -GC -Lipid Age dependent: Future -Dysplasia screenM -Colonoscopy    Danelle Earthly, MD [facility] I  have personally spent 52 minutes involved in face-to-face and non-face-to-face activities for this patient on the day of the visit. Professional time spent includes the following activities: Preparing to see the patient (review of tests), Obtaining and/or reviewing separately obtained history (admission/discharge record), Performing a medically appropriate examination and/or evaluation , Ordering medications/tests/procedures, referring and communicating with other health care professionals, Documenting clinical information in the EMR, Independently interpreting results (not  separately reported), Communicating results to the patient/family/caregiver, Counseling and educating the patient/family/caregiver and Care coordination (not separately reported).

## 2023-11-29 LAB — T-HELPER CELLS (CD4) COUNT (NOT AT ARMC)
CD4 % Helper T Cell: 44 % (ref 33–65)
CD4 T Cell Abs: 862 /uL (ref 400–1790)

## 2023-12-01 LAB — COMPLETE METABOLIC PANEL WITH GFR
AG Ratio: 1.2 (calc) (ref 1.0–2.5)
ALT: 28 U/L (ref 9–46)
AST: 36 U/L (ref 10–40)
Albumin: 4.7 g/dL (ref 3.6–5.1)
Alkaline phosphatase (APISO): 113 U/L (ref 36–130)
BUN: 10 mg/dL (ref 7–25)
CO2: 22 mmol/L (ref 20–32)
Calcium: 9.8 mg/dL (ref 8.6–10.3)
Chloride: 98 mmol/L (ref 98–110)
Creat: 0.92 mg/dL (ref 0.60–1.26)
Globulin: 4 g/dL — ABNORMAL HIGH (ref 1.9–3.7)
Glucose, Bld: 82 mg/dL (ref 65–99)
Potassium: 4.4 mmol/L (ref 3.5–5.3)
Sodium: 138 mmol/L (ref 135–146)
Total Bilirubin: 0.5 mg/dL (ref 0.2–1.2)
Total Protein: 8.7 g/dL — ABNORMAL HIGH (ref 6.1–8.1)
eGFR: 113 mL/min/{1.73_m2} (ref >=60)

## 2023-12-01 LAB — CBC WITH DIFFERENTIAL/PLATELET
Absolute Lymphocytes: 2090 {cells}/uL (ref 850–3900)
Absolute Monocytes: 798 {cells}/uL (ref 200–950)
Basophils Absolute: 61 {cells}/uL (ref 0–200)
Basophils Relative: 0.8 %
Eosinophils Absolute: 220 {cells}/uL (ref 15–500)
Eosinophils Relative: 2.9 %
HCT: 42.5 % (ref 38.5–50.0)
Hemoglobin: 14.2 g/dL (ref 13.2–17.1)
MCH: 32.2 pg (ref 27.0–33.0)
MCHC: 33.4 g/dL (ref 32.0–36.0)
MCV: 96.4 fL (ref 80.0–100.0)
MPV: 10.3 fL (ref 7.5–12.5)
Monocytes Relative: 10.5 %
Neutro Abs: 4431 {cells}/uL (ref 1500–7800)
Neutrophils Relative %: 58.3 %
Platelets: 404 10*3/uL — ABNORMAL HIGH (ref 140–400)
RBC: 4.41 10*6/uL (ref 4.20–5.80)
RDW: 14.5 % (ref 11.0–15.0)
Total Lymphocyte: 27.5 %
WBC: 7.6 10*3/uL (ref 3.8–10.8)

## 2023-12-01 LAB — QUANTIFERON-TB GOLD PLUS
Mitogen-NIL: 1.8 [IU]/mL
NIL: 0.1 [IU]/mL
QuantiFERON-TB Gold Plus: NEGATIVE
TB1-NIL: <0 [IU]/mL
TB2-NIL: <0 [IU]/mL

## 2023-12-01 LAB — LIPID PANEL
Cholesterol: 256 mg/dL — ABNORMAL HIGH (ref <200)
HDL: 86 mg/dL (ref >=40)
LDL Cholesterol (Calc): 147 mg/dL — ABNORMAL HIGH
Non-HDL Cholesterol (Calc): 170 mg/dL — ABNORMAL HIGH (ref <130)
Total CHOL/HDL Ratio: 3 (calc) (ref <5.0)
Triglycerides: 116 mg/dL (ref <150)

## 2023-12-01 LAB — HIV RNA, RTPCR W/R GT (RTI, PI,INT)
HIV 1 RNA Quant: NOT DETECTED {copies}/mL
HIV-1 RNA Quant, Log: NOT DETECTED {Log}

## 2023-12-01 LAB — HEPATITIS A ANTIBODY, TOTAL: Hepatitis A AB,Total: REACTIVE — AB

## 2023-12-01 LAB — RPR: RPR Ser Ql: NONREACTIVE

## 2023-12-01 LAB — HEPATITIS C ANTIBODY: Hepatitis C Ab: NONREACTIVE

## 2023-12-02 LAB — URINE CYTOLOGY ANCILLARY ONLY
Chlamydia: NEGATIVE
Comment: NEGATIVE
Comment: NORMAL
Neisseria Gonorrhea: NEGATIVE

## 2023-12-02 LAB — CYTOLOGY, (ORAL, ANAL, URETHRAL) ANCILLARY ONLY
Chlamydia: NEGATIVE
Chlamydia: NEGATIVE
Comment: NEGATIVE
Comment: NEGATIVE
Comment: NORMAL
Comment: NORMAL
Neisseria Gonorrhea: NEGATIVE
Neisseria Gonorrhea: NEGATIVE

## 2023-12-04 ENCOUNTER — Other Ambulatory Visit: Payer: Self-pay | Admitting: Pharmacist

## 2023-12-04 MED ORDER — BICTEGRAVIR-EMTRICITAB-TENOFOV 50-200-25 MG PO TABS: 1.0000 | ORAL_TABLET | Freq: Every day | ORAL | Status: AC

## 2023-12-04 NOTE — Progress Notes (Signed)
Medication Samples have been provided to the patient.  Drug name: Biktarvy        Strength: 50/200/25 mg       Qty: 14 tablets (2 bottles) LOT: CSCFVA   Exp.Date: 10/26  Dosing instructions: Take one tablet by mouth once daily  The patient has been instructed regarding the correct time, dose, and frequency of taking this medication, including desired effects and most common side effects.   Margarite Gouge, PharmD, CPP, BCIDP, AAHIVP Clinical Pharmacist Practitioner Infectious Diseases Clinical Pharmacist [facility]

## 2023-12-19 NOTE — Telephone Encounter (Signed)
 Daiel called, let him know that his concerns were being forwarded to [place].   Notified him that he should have refills of Biktarvy on file at [facility] and gave him their number to set up delivery.   Sandie Ano, RN

## 2023-12-23 ENCOUNTER — Ambulatory Visit (INDEPENDENT_AMBULATORY_CARE_PROVIDER_SITE_OTHER): Payer: Commercial Managed Care - HMO | Admitting: Infectious Diseases

## 2023-12-23 ENCOUNTER — Other Ambulatory Visit: Payer: Self-pay

## 2023-12-23 ENCOUNTER — Encounter: Payer: Self-pay | Admitting: Infectious Diseases

## 2023-12-23 VITALS — BP 154/81 | HR 72 | Temp 98.1°F | Wt 138.0 lb

## 2023-12-23 DIAGNOSIS — F32A Depression, unspecified: Secondary | ICD-10-CM

## 2023-12-23 DIAGNOSIS — F515 Nightmare disorder: Secondary | ICD-10-CM

## 2023-12-23 MED ORDER — MIRTAZAPINE 15 MG PO TABS: 15.0000 mg | ORAL_TABLET | Freq: Every day | ORAL | 1 refills | Status: AC

## 2023-12-23 MED ORDER — PRAZOSIN HCL 1 MG PO CAPS: 1.0000 mg | ORAL_CAPSULE | Freq: Every day | ORAL | 1 refills | Status: AC

## 2023-12-23 NOTE — Progress Notes (Signed)
 Name: Robert Sharp  DOB: [birth year] MRN: [id number] PCP: Patient, No Pcp Per    Brief Narrative:  Robert Sharp is a 33 y.o. male with HIV, stage 2, Dx 11/16/17.  CD4 nadir > 200 VL unknown Transmission Risk: msm History of OIs: none History of STIs: Hep B sAg (-), sAb (+), cAb (-); Hep A (+), Hep C (-) Quantiferon (-) HLA B*5701 () G6PD: ()   Previous Regimens: Triumeq Biktarvy  Genotypes: Not on file  Subjective   Subjective:   Chief Complaint  Patient presents with   Follow-up     Discussed the use of AI scribe software for clinical note transcription with the patient, who gave verbal consent to proceed.  History of Present Illness   The patient presents for medication management and mental health concerns.  He is currently taking Biktarvy for HIV management, which he receives from the [facility] in [place]. He has been on prazosin for about nine months for nightmares, which he finds helpful. However, he has not been sleeping well recently, attributing this to being out of prazosin. He was previously on mirtazapine a couple of years ago and was switched to sertraline, which did not help his mood. He has not taken sertraline for over a month and is considering resuming mirtazapine to help with sleep and mood.  He experiences a combination of depression and anxiety, with anxiety being more prominent on bad days. He has a long history of depressed mood, stating 'I didn't want to do nothing. I was laying in bed.' No hospitalizations, suicide attempts, or current fear of self-harm. He sometimes feels like panic might come on but does not experience full panic attacks.  His family history includes his mother and brother, who both attend [facility] for mental health services and have had positive experiences. His mother has had panic attacks and has received support from [place].           12/23/2023    1:04 PM  Depression screen PHQ 2/9   Decreased Interest 0  Down, Depressed, Hopeless 0  PHQ - 2 Score 0    Review of Systems  Constitutional:  Negative for fever, malaise/fatigue and weight loss.  Respiratory: Negative.    Cardiovascular: Negative.   Psychiatric/Behavioral:  Positive for depression. Negative for hallucinations, substance abuse and suicidal ideas. The patient is nervous/anxious and has insomnia.     Past Medical History:  Diagnosis Date   HIV (human immunodeficiency virus infection) (HCC) 01/2016    Outpatient Medications Prior to Visit  Medication Sig Dispense Refill   bictegravir-emtricitabine-tenofovir AF (BIKTARVY) 50-200-25 MG TABS tablet Take 1 tablet by mouth daily. 30 tablet 3   prazosin (MINIPRESS) 1 MG capsule Take 1 mg by mouth at bedtime.     sertraline (ZOLOFT) 100 MG tablet Take 100 mg by mouth daily.     mirtazapine (REMERON) 15 MG tablet TAKE 1 TABLET(15 MG) BY MOUTH AT BEDTIME (Patient not taking: Reported on 12/23/2023) 30 tablet 1   No facility-administered medications prior to visit.     No Known Allergies  Social History   Tobacco Use   Smoking status: Every Day    Current packs/day: 0.50    Types: Cigarettes   Smokeless tobacco: Never   Tobacco comments:    A few cigarettes a day  Substance Use Topics   Alcohol use: Yes    Alcohol/week: 20.0 standard drinks of alcohol    Types: 20 Cans of beer per week    Comment:  4-5 days / week, 2-3 40 oz cans of beer   Drug use: No    No family history on file.  Social History   Substance and Sexual Activity  Sexual Activity Yes   Partners: Male   Birth control/protection: Condom        Objective   Objective:   Vitals:   12/23/23 1303  BP: (!) 154/81  Pulse: 72  Temp: 98.1 F (36.7 C)  TempSrc: Oral  SpO2: 100%  Weight: 138 lb (62.6 kg)   Body mass index is 20.98 kg/m.  Physical Exam Constitutional:      Appearance: Normal appearance. He is not ill-appearing.  HENT:     Head: Normocephalic.      Mouth/Throat:     Mouth: Mucous membranes are moist.     Pharynx: Oropharynx is clear.  Eyes:     General: No scleral icterus. Pulmonary:     Effort: Pulmonary effort is normal.  Musculoskeletal:        General: Normal range of motion.     Cervical back: Normal range of motion.  Skin:    Coloration: Skin is not jaundiced or pale.  Neurological:     Mental Status: He is alert and oriented to person, place, and time.  Psychiatric:        Mood and Affect: Mood normal.        Judgment: Judgment normal.           Assessment & Plan:     HIV infection On Biktarvy with undetectable viral load and CD4 count of 862, indicating effective viral suppression and no disease progression. - Continue Biktarvy as prescribed. - Monitor for medication access issues and contact clinic if needed.  Depression and Anxiety Reports depression and anxiety, with anxiety more prominent. Sertraline ineffective. Sleep disturbances and nightmares worsened since stopping prazosin. No self-harm or suicidal ideation. Open to new medication regimen and mental health support. Mirtazapine considered for sleep and mood. Prazosin to be restarted for nightmares. Referral to psychiatrist planned. - Discontinue sertraline. - Initiate mirtazapine 15 mg at night for sleep and mood. - Restart prazosin 1 mg at night for nightmares. - Refer to psychiatrist for further management. - Provide information on [facility] for mental health services, including walk-in hours and virtual visit options. - Schedule follow-up in one month to assess response to medication changes.         No orders of the defined types were placed in this encounter.   Meds ordered this encounter  Medications   mirtazapine (REMERON) 15 MG tablet    Sig: Take 1 tablet (15 mg total) by mouth at bedtime.    Dispense:  30 tablet    Refill:  1   prazosin (MINIPRESS) 1 MG capsule    Sig: Take 1 capsule (1 mg total) by mouth at bedtime.    Dispense:   30 capsule    Refill:  1    No follow-ups on file.   Rexene Alberts, MSN, NP-C [facility]  [email or web address] Pager: [phone] Office: [phone] [facility] Main Line: [phone] *Secure Chat Communication Welcome

## 2023-12-23 NOTE — Patient Instructions (Addendum)
  [facility]  Address: Chief Financial Officer at [facility], [address], [place], [place] [address] Phone: [phone]  If you would schedule a follow up with me in 1 months just incase you can't get into [facility] timely  Otherwise we can see you back in June/July for regular care.

## 2024-01-23 ENCOUNTER — Ambulatory Visit: Admitting: Infectious Diseases

## 2024-03-19 ENCOUNTER — Ambulatory Visit: Admitting: Infectious Diseases

## 2024-05-02 ENCOUNTER — Other Ambulatory Visit: Payer: Self-pay | Admitting: Internal Medicine

## 2024-05-02 DIAGNOSIS — B20 Human immunodeficiency virus [HIV] disease: Secondary | ICD-10-CM

## 2024-07-27 ENCOUNTER — Ambulatory Visit: Admitting: Infectious Diseases

## 2025-01-12 ENCOUNTER — Ambulatory Visit: Payer: Self-pay | Admitting: Infectious Diseases
